# Patient Record
Sex: Female | Born: 2000 | Race: Black or African American | Hispanic: No | Marital: Single | Smoking: Never smoker
Health system: Southern US, Community
[De-identification: ages and names within clinical notes are randomized; demographics above are authoritative.]

## PROBLEM LIST (undated history)

## (undated) DIAGNOSIS — B20 Human immunodeficiency virus [HIV] disease: Secondary | ICD-10-CM

## (undated) DIAGNOSIS — T7840XA Allergy, unspecified, initial encounter: Secondary | ICD-10-CM

## (undated) DIAGNOSIS — U071 COVID-19: Secondary | ICD-10-CM

## (undated) DIAGNOSIS — I10 Essential (primary) hypertension: Secondary | ICD-10-CM

## (undated) HISTORY — DX: Allergy, unspecified, initial encounter: T78.40XA

## (undated) HISTORY — PX: FEMUR FRACTURE SURGERY: SHX633

## (undated) HISTORY — DX: Human immunodeficiency virus (HIV) disease: B20

## (undated) HISTORY — DX: Essential (primary) hypertension: I10

---
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
## 2021-05-17 ENCOUNTER — Ambulatory Visit
Admission: EM | Admit: 2021-05-17 | Discharge: 2021-05-17 | Disposition: A | Discharge status: Home / Self Care | Payer: BC Managed Care – PPO

## 2021-05-17 ENCOUNTER — Encounter: Payer: Self-pay | Admitting: *Deleted

## 2021-05-17 ENCOUNTER — Other Ambulatory Visit: Payer: Self-pay

## 2021-05-17 ENCOUNTER — Ambulatory Visit: Payer: Self-pay

## 2021-05-17 DIAGNOSIS — L500 Allergic urticaria: Secondary | ICD-10-CM | POA: Diagnosis not present

## 2021-05-17 DIAGNOSIS — U071 COVID-19: Secondary | ICD-10-CM | POA: Diagnosis not present

## 2021-05-17 HISTORY — DX: COVID-19: U07.1

## 2021-05-17 MED ORDER — PREDNISONE 10 MG PO TABS: ORAL_TABLET | ORAL | 0 refills | Status: DC

## 2021-05-17 MED ORDER — FAMOTIDINE 40 MG PO TABS: 40.0000 mg | ORAL_TABLET | Freq: Two times a day (BID) | ORAL | 0 refills | Status: DC

## 2021-05-17 MED ORDER — CETIRIZINE HCL 10 MG PO TABS: 10.0000 mg | ORAL_TABLET | Freq: Two times a day (BID) | ORAL | 0 refills | Status: DC

## 2021-05-17 NOTE — Telephone Encounter (Signed)
Answer Assessment - Initial Assessment Questions 1. NAME of MEDICATION: "What medicine are you calling about?"     Paxlovid 2. QUESTION: "What is your question?" (e.g., double dose of medicine, side effect)     Causing hives and redness to skin 3. PRESCRIBING HCP: "Who prescribed it?" Reason: if prescribed by specialist, call should be referred to that group.     [facility] 4. SYMPTOMS: "Do you have any symptoms?"     Yes 5. SEVERITY: If symptoms are present, ask "Are they mild, moderate or severe?"     Moderate 6. PREGNANCY:  "Is there any chance that you are pregnant?" "When was your last menstrual period?"     No  Protocols used: Medication Question Call-A-AH

## 2021-05-17 NOTE — ED Triage Notes (Signed)
T/C to pt as she waits in car; reports having allergic reaction since starting Paxlovid 2 days ago.  C/O generalized hives.  Denies any wheezing, oral, or throat swelling, itching, or sensations.  Instructed pt to notify [facility] if anything changes; pt verbalized understanding.

## 2021-05-17 NOTE — Telephone Encounter (Signed)
Pt. Recently started Paxlovid and today broke out in hives "all over, even my toes." Called [facility] who prescribed the medication. States :they don't have any openings." Will try e-visit through [facility] or go to UC.

## 2021-05-17 NOTE — ED Provider Notes (Signed)
[facility]    CSN: [id number] Arrival date & time: 05/17/21  1633      History   Chief Complaint Chief Complaint  Patient presents with   Urticaria    Covid +    HPI Pam Jones is a 20 y.o. female.   Patient presenting today with generalized pruritic hives rash following starting paxlovid for COVID infection. She denies wheezing, throat swelling or itching, difficulty breathing or swallowing, fever, cough, N/V. So far has not tried anything for sxs. Stopped the paxlovid immediately upon noticing the rash.    Past Medical History:  Diagnosis Date   Allergy    COVID-19    04/2021    There are no problems to display for this patient.   Past Surgical History:  Procedure Laterality Date   FEMUR FRACTURE SURGERY      OB History   No obstetric history on file.      Home Medications    Prior to Admission medications   Medication Sig Start Date End Date Taking? Authorizing Provider  AMOXICILLIN PO Take by mouth.   Yes [provider]  cetirizine (ZYRTEC ALLERGY) 10 MG tablet Take 1 tablet (10 mg total) by mouth 2 (two) times daily. 05/17/21  Yes Particia Nearing, PA-C  famotidine (PEPCID) 40 MG tablet Take 1 tablet (40 mg total) by mouth 2 (two) times daily. 05/17/21  Yes Particia Nearing, PA-C  predniSONE (DELTASONE) 10 MG tablet Take 6 tabs daily x 2 days, 5 tabs daily x 2 days, 4 tabs daily x 2 days, etc 05/17/21  Yes Particia Nearing, PA-C  fluticasone [facility]) 50 MCG/ACT nasal spray Place 2 sprays into the nose daily. 08/13/13   Carmelina Dane, MD  ibuprofen (ADVIL,MOTRIN) 600 MG tablet Take 1 tablet (600 mg total) by mouth every 6 (six) hours as needed. 07/05/16   Ronnell Freshwater, NP  ipratropium (ATROVENT) 0.03 % nasal spray Place 2 sprays into the nose 2 (two) times daily. 02/25/13   Godfrey Pick, PA-C  oseltamivir (TAMIFLU) 75 MG capsule Take 1 capsule (75 mg total) by mouth 2 (two) times daily.  11/13/13   [name], MD    Family History Family History  Problem Relation Age of Onset   Healthy Mother    Hypertension Father    Hypertension Paternal Grandmother    Diabetes Paternal Grandmother     Social History Social History   Tobacco Use   Smoking status: Never    Passive exposure: Yes   Smokeless tobacco: Never  Vaping Use   Vaping Use: Never used  Substance Use Topics   Alcohol use: No   Drug use: No     Allergies   Kiwi extract, Paxlovid [nirmatrelvir-ritonavir], and Strawberry (diagnostic)   Review of Systems Review of Systems PER HPI  Physical Exam Triage Vital Signs ED Triage Vitals [05/17/21 1911]  Enc Vitals Group     BP 125/75     Pulse Rate 80     Resp 16     Temp 98.4 F (36.9 C)     Temp Source Temporal     SpO2 98 %     Weight      Height      Head Circumference      Peak Flow      Pain Score 0     Pain Loc      Pain Edu?      Excl. in GC?    No  data found.  Updated Vital Signs BP 125/75   Pulse 80   Temp 98.4 F (36.9 C) (Temporal)   Resp 16   LMP 05/03/2021 (Exact Date)   SpO2 98%   Visual Acuity Right Eye Distance:   Left Eye Distance:   Bilateral Distance:    Right Eye Near:   Left Eye Near:    Bilateral Near:     Physical Exam Vitals and nursing note reviewed.  Constitutional:      Appearance: Normal appearance. She is not ill-appearing.  HENT:     Head: Atraumatic.     Nose: Nose normal.     Mouth/Throat:     Mouth: Mucous membranes are moist.     Pharynx: Oropharynx is clear. No oropharyngeal exudate or posterior oropharyngeal erythema.     Comments: Uvula midline, no erythema or edema, oral airway patent Eyes:     Extraocular Movements: Extraocular movements intact.     Conjunctiva/sclera: Conjunctivae normal.  Cardiovascular:     Rate and Rhythm: Normal rate and regular rhythm.     Heart sounds: Normal heart sounds.  Pulmonary:     Effort: Pulmonary effort is normal.     Breath sounds:  Normal breath sounds. No wheezing or rales.  Musculoskeletal:        General: Normal range of motion.     Cervical back: Normal range of motion and neck supple.  Skin:    General: Skin is warm and dry.     Findings: Rash (diffuse erythematous maculopapular rash across body) present.  Neurological:     Mental Status: She is alert and oriented to person, place, and time.  Psychiatric:        Mood and Affect: Mood normal.        Thought Content: Thought content normal.        Judgment: Judgment normal.     UC Treatments / Results  Labs (all labs ordered are listed, but only abnormal results are displayed) Labs Reviewed - No data to display  EKG   Radiology No results found.  Procedures Procedures (including critical care time)  Medications Ordered in UC Medications - No data to display  Initial Impression / Assessment and Plan / UC Course  I have reviewed the triage vital signs and the nursing notes.  Pertinent labs & imaging results that were available during my care of the patient were reviewed by me and considered in my medical decision making (see chart for details).     Generalized hives, likely from paxlovid as this is only new exposure. Added to allergy list, continue d/c. No evidence of systemic involvement of reaction. Extended prednisone taper, pepcid, zyrtec sent and reviewed. Supportive care and return precautions discussed. Per patient is improving well from COVID without concerns.   Final Clinical Impressions(s) / UC Diagnoses   Final diagnoses:  Allergic urticaria  COVID-19   Discharge Instructions   None    ED Prescriptions     Medication Sig Dispense Auth. Provider   predniSONE (DELTASONE) 10 MG tablet Take 6 tabs daily x 2 days, 5 tabs daily x 2 days, 4 tabs daily x 2 days, etc 42 tablet Particia Nearing, PA-C   cetirizine (ZYRTEC ALLERGY) 10 MG tablet Take 1 tablet (10 mg total) by mouth 2 (two) times daily. 30 tablet Particia Nearing, [place]   famotidine (PEPCID) 40 MG tablet Take 1 tablet (40 mg total) by mouth 2 (two) times daily. 30 tablet Particia Nearing, [place]  PDMP not reviewed this encounter.   Roosvelt Maser [place], [place] 05/21/21 [phone]

## 2021-07-21 ENCOUNTER — Encounter (HOSPITAL_COMMUNITY): Payer: Self-pay

## 2021-07-21 ENCOUNTER — Other Ambulatory Visit: Payer: Self-pay

## 2021-07-21 ENCOUNTER — Emergency Department (HOSPITAL_COMMUNITY)
Admission: EM | Admit: 2021-07-21 | Discharge: 2021-07-21 | Disposition: A | Discharge status: Home / Self Care | Payer: BC Managed Care – PPO | Attending: Emergency Medicine | Admitting: Emergency Medicine

## 2021-07-21 DIAGNOSIS — K029 Dental caries, unspecified: Secondary | ICD-10-CM | POA: Insufficient documentation

## 2021-07-21 DIAGNOSIS — Z8616 Personal history of COVID-19: Secondary | ICD-10-CM | POA: Insufficient documentation

## 2021-07-21 DIAGNOSIS — K0889 Other specified disorders of teeth and supporting structures: Secondary | ICD-10-CM

## 2021-07-21 LAB — CBC WITH DIFFERENTIAL/PLATELET
Abs Immature Granulocytes: 0.02 10*3/uL (ref 0.00–0.07)
Basophils Absolute: 0 10*3/uL (ref 0.0–0.1)
Basophils Relative: 0 %
Eosinophils Absolute: 0 10*3/uL (ref 0.0–0.5)
Eosinophils Relative: 0 %
HCT: 36.6 % (ref 36.0–46.0)
Hemoglobin: 11.6 g/dL — ABNORMAL LOW (ref 12.0–15.0)
Immature Granulocytes: 0 %
Lymphocytes Relative: 27 %
Lymphs Abs: 1.9 10*3/uL (ref 0.7–4.0)
MCH: 26.2 pg (ref 26.0–34.0)
MCHC: 31.7 g/dL (ref 30.0–36.0)
MCV: 82.8 fL (ref 80.0–100.0)
Monocytes Absolute: 0.5 10*3/uL (ref 0.1–1.0)
Monocytes Relative: 7 %
Neutro Abs: 4.7 10*3/uL (ref 1.7–7.7)
Neutrophils Relative %: 66 %
Platelets: 246 10*3/uL (ref 150–400)
RBC: 4.42 MIL/uL (ref 3.87–5.11)
RDW: 13.3 % (ref 11.5–15.5)
WBC: 7.2 10*3/uL (ref 4.0–10.5)
nRBC: 0 % (ref 0.0–0.2)

## 2021-07-21 LAB — CBG MONITORING, ED: Glucose-Capillary: 87 mg/dL (ref 70–99)

## 2021-07-21 LAB — COMPREHENSIVE METABOLIC PANEL
ALT: 23 U/L (ref 0–44)
AST: 24 U/L (ref 15–41)
Albumin: 4.1 g/dL (ref 3.5–5.0)
Alkaline Phosphatase: 54 U/L (ref 38–126)
Anion gap: 8 (ref 5–15)
BUN: 12 mg/dL (ref 6–20)
CO2: 24 mmol/L (ref 22–32)
Calcium: 9.3 mg/dL (ref 8.9–10.3)
Chloride: 106 mmol/L (ref 98–111)
Creatinine, Ser: 0.9 mg/dL (ref 0.44–1.00)
GFR, Estimated: >60 mL/min (ref >60)
Glucose, Bld: 90 mg/dL (ref 70–99)
Potassium: 3.7 mmol/L (ref 3.5–5.1)
Sodium: 138 mmol/L (ref 135–145)
Total Bilirubin: 0.7 mg/dL (ref 0.3–1.2)
Total Protein: 6.8 g/dL (ref 6.5–8.1)

## 2021-07-21 LAB — I-STAT BETA HCG BLOOD, ED (MC, WL, AP ONLY): I-stat hCG, quantitative: <5 m[IU]/mL (ref <5)

## 2021-07-21 MED ORDER — KETOROLAC TROMETHAMINE 15 MG/ML IJ SOLN
15.0000 mg | Freq: Once | INTRAMUSCULAR | Status: AC
  Administered 2021-07-21: 15 mg via INTRAMUSCULAR
  Filled 2021-07-21: qty 1

## 2021-07-21 NOTE — ED Provider Notes (Signed)
[facility] EMERGENCY DEPARTMENT Provider Note   CSN: [id number] Arrival date & time: 07/21/21  1713     History Chief Complaint  Patient presents with   Rt sided tooth pain    Pam Jones is a 20 y.o. female.  20 year old female with prior medical history as detailed below presents for evaluation.  Patient complains of pain to a tooth on the right lower jaw.  This has been an ongoing issue for the last several months.  Patient reports increased pain several days.  Patient reports that she was started on amoxicillin a couple of days prior. She reports mild allergic reaction to Amoxicillin and her dentist changed her to a different antibiotic.  Also, of note, patient with brief period of lightheadedness while in triage.  This occurred immediately after blood draw.  Patient reports that the symptoms have completely resolved upon my evaluation.  The history is provided by the patient.  Illness Location:  Dental pain Severity:  Mild Onset quality:  Gradual Duration:  5 days Timing:  Constant Progression:  Waxing and waning Chronicity:  Recurrent Associated symptoms: no fever       Past Medical History:  Diagnosis Date   Allergy    COVID-19    04/2021    There are no problems to display for this patient.   Past Surgical History:  Procedure Laterality Date   FEMUR FRACTURE SURGERY       OB History   No obstetric history on file.     Family History  Problem Relation Age of Onset   Healthy Mother    Hypertension Father    Hypertension Paternal Grandmother    Diabetes Paternal Grandmother     Social History   Tobacco Use   Smoking status: Never    Passive exposure: Yes   Smokeless tobacco: Never  Vaping Use   Vaping Use: Never used  Substance Use Topics   Alcohol use: No   Drug use: No    Home Medications Prior to Admission medications   Medication Sig Start Date End Date Taking? Authorizing Provider  AMOXICILLIN PO Take by  mouth.    [provider]  cetirizine (ZYRTEC ALLERGY) 10 MG tablet Take 1 tablet (10 mg total) by mouth 2 (two) times daily. 05/17/21   Particia Nearing, PA-C  famotidine (PEPCID) 40 MG tablet Take 1 tablet (40 mg total) by mouth 2 (two) times daily. 05/17/21   Particia Nearing, PA-C  fluticasone [facility]) 50 MCG/ACT nasal spray Place 2 sprays into the nose daily. 08/13/13   Carmelina Dane, MD  ibuprofen (ADVIL,MOTRIN) 600 MG tablet Take 1 tablet (600 mg total) by mouth every 6 (six) hours as needed. 07/05/16   Ronnell Freshwater, NP  ipratropium (ATROVENT) 0.03 % nasal spray Place 2 sprays into the nose 2 (two) times daily. 02/25/13   Godfrey Pick, PA-C  oseltamivir (TAMIFLU) 75 MG capsule Take 1 capsule (75 mg total) by mouth 2 (two) times daily. 11/13/13   [name], MD  predniSONE (DELTASONE) 10 MG tablet Take 6 tabs daily x 2 days, 5 tabs daily x 2 days, 4 tabs daily x 2 days, etc 05/17/21   Particia Nearing, PA-C    Allergies    Kiwi extract, Paxlovid [nirmatrelvir-ritonavir], and Strawberry (diagnostic)  Review of Systems   Review of Systems  Constitutional:  Negative for fever.  All other systems reviewed and are negative.  Physical Exam Updated Vital Signs BP  131/87 (BP Location: Right Arm)   Pulse 79   Temp 98 F (36.7 C) (Oral)   Resp 18   SpO2 100%   Physical Exam Vitals and nursing note reviewed.  Constitutional:      General: She is not in acute distress.    Appearance: Normal appearance. She is well-developed.  HENT:     Head: Normocephalic and atraumatic.     Mouth/Throat:     Comments: Diffuse mild dental decay - no dental abscess noted.  Eyes:     Conjunctiva/sclera: Conjunctivae normal.     Pupils: Pupils are equal, round, and reactive to light.  Cardiovascular:     Rate and Rhythm: Normal rate and regular rhythm.     Heart sounds: Normal heart sounds.  Pulmonary:     Effort: Pulmonary effort is normal. No  respiratory distress.     Breath sounds: Normal breath sounds.  Abdominal:     General: There is no distension.     Palpations: Abdomen is soft.     Tenderness: There is no abdominal tenderness.  Musculoskeletal:        General: No deformity. Normal range of motion.     Cervical back: Normal range of motion and neck supple.  Skin:    General: Skin is warm and dry.  Neurological:     General: No focal deficit present.     Mental Status: She is alert and oriented to person, place, and time.    ED Results / Procedures / Treatments   Labs (all labs ordered are listed, but only abnormal results are displayed) Labs Reviewed  CBC WITH DIFFERENTIAL/PLATELET - Abnormal; Notable for the following components:      Result Value   Hemoglobin 11.6 (*)    All other components within normal limits  COMPREHENSIVE METABOLIC PANEL  I-STAT BETA HCG BLOOD, ED ([facility], [facility], [facility] ONLY)  CBG MONITORING, ED    EKG EKG Interpretation  Date/Time:  Friday July 21 2021 18:12:28 EDT Ventricular Rate:  78 PR Interval:  168 QRS Duration: 84 QT Interval:  346 QTC Calculation: 394 R Axis:   84 Text Interpretation: Normal sinus rhythm with sinus arrhythmia Normal ECG Confirmed by Kristine Royal [phone]) on 07/21/2021 8:31:25 PM  Radiology No results found.  Procedures Procedures   Medications Ordered in ED Medications  ketorolac (TORADOL) 15 MG/ML injection 15 mg (has no administration in time range)    ED Course  I have reviewed the triage vital signs and the nursing notes.  Pertinent labs & imaging results that were available during my care of the patient were reviewed by me and considered in my medical decision making (see chart for details).  Clinical Course as of 07/25/21 6283  Fri Jul 21, 2021  1831 Patient had what appeared to be a near syncopal event after she got her blood drawn.  She became diaphoretic, lightheaded and began being very tremulous.  CBG is 87.  She was laid down and felt  better.  She has not eaten in 4 hours.  EKG was obtained during this time.  She states that she donates blood often and does not have a similar reaction.   [EH]    Clinical Course User Index [EH] Norman Clay   MDM Rules/Calculators/A&P                           MDM   MSE complete  [name] was evaluated in Emergency  Department on 07/25/2021 for the symptoms described in the history of present illness. She was evaluated in the context of the global COVID-19 pandemic, which necessitated consideration that the patient might be at risk for infection with the SARS-CoV-2 virus that causes COVID-19. Institutional protocols and algorithms that pertain to the evaluation of patients at risk for COVID-19 are in a state of rapid change based on information released by regulatory bodies including the CDC and federal and state organizations. These policies and algorithms were followed during the patient's care in the ED.  Patient with complaint of mild dental pain.   She reports that she is already taking antibiotics for same.   She requests pain medication for dental pain.  Pain improved with Toradol.   Screening labs obtained are without significant findings.   Reported vasovagal symptoms with triage blood draw are now resolved.   She does understand need for close FU with dentistry. Strict return precautions given and understood.  Final Clinical Impression(s) / ED Diagnoses Final diagnoses:  Pain, dental    Rx / DC Orders ED Discharge Orders     None        Wynetta Fines, MD 07/25/21 [phone]

## 2021-07-21 NOTE — ED Provider Notes (Signed)
Emergency Medicine Provider Triage Evaluation Note  Pam Jones , a 20 y.o. female  was evaluated in triage.  Pt complains of right-sided dental pain.  She states that she was seen for similar around earlier this year after she had a infection in January.  She had an allergic reaction to amoxicillin.  She states that she has been to 3 dentist and they will tell her that she needs to have her other dental issues fixed before they will fix or fill/pull that tooth.  She states that today her primary concern is that she has pain that shoots up into her head and even down her right arm.  She denies any fevers..  Review of Systems  Positive: Right-sided dental pain, tooth pain, right-sided arm pain Negative: Cough, shortness of breath  Physical Exam  BP 138/90 (BP Location: Left Arm)   Pulse 77   Temp 99 F (37.2 C) (Oral)   Resp 14   SpO2 100%  Gen:   Awake, no distress   Resp:  Normal effort  MSK:   Moves extremities without difficulty  Other:  Well exam without obvious intraoral edema.  Uvula is midline.  No trismus.  She does have nontender left sided cervical lymphadenopathy as opposed to the right sided dental pain where she does not have any right-sided lymphadenopathy.  Medical Decision Making  Medically screening exam initiated at 6:11 PM.  Appropriate orders placed.  Pam Jones was informed that the remainder of the evaluation will be completed by another provider, this initial triage assessment does not replace that evaluation, and the importance of remaining in the ED until their evaluation is complete.  Given that patient is feeling like this dental pain is now shooting down into her arm I feel that this is a atypical pattern and patient requires additional work-up.     Norman Clay 07/21/21 1812    Cheryll Cockayne, MD 07/24/21 [phone]

## 2021-07-21 NOTE — Discharge Instructions (Addendum)
Return for any problem.  ?

## 2021-07-21 NOTE — ED Triage Notes (Signed)
Pt came in via POV d/t tooth pain that worsened last night, her initial tooth ache began around January & she did see her dentist back in June & she was prescribed Amoxicillin that she had an allergic reaction to. The pain became severe enough that she states it "spread" into her jaw/face/right arm.

## 2022-03-26 DIAGNOSIS — Z21 Asymptomatic human immunodeficiency virus [HIV] infection status: Secondary | ICD-10-CM

## 2022-03-26 DIAGNOSIS — B20 Human immunodeficiency virus [HIV] disease: Secondary | ICD-10-CM

## 2022-03-26 HISTORY — DX: Human immunodeficiency virus (HIV) disease: B20

## 2022-03-26 HISTORY — DX: Asymptomatic human immunodeficiency virus (hiv) infection status: Z21

## 2022-04-05 ENCOUNTER — Encounter (HOSPITAL_COMMUNITY): Payer: Self-pay | Admitting: Emergency Medicine

## 2022-04-05 ENCOUNTER — Emergency Department (HOSPITAL_COMMUNITY): Payer: BC Managed Care – PPO

## 2022-04-05 ENCOUNTER — Emergency Department (HOSPITAL_COMMUNITY)
Admission: EM | Admit: 2022-04-05 | Discharge: 2022-04-06 | Disposition: A | Discharge status: Home / Self Care | Payer: BC Managed Care – PPO | Attending: Emergency Medicine | Admitting: Emergency Medicine

## 2022-04-05 ENCOUNTER — Other Ambulatory Visit: Payer: Self-pay

## 2022-04-05 DIAGNOSIS — Z21 Asymptomatic human immunodeficiency virus [HIV] infection status: Secondary | ICD-10-CM | POA: Diagnosis not present

## 2022-04-05 DIAGNOSIS — R079 Chest pain, unspecified: Secondary | ICD-10-CM | POA: Diagnosis present

## 2022-04-05 DIAGNOSIS — N9489 Other specified conditions associated with female genital organs and menstrual cycle: Secondary | ICD-10-CM | POA: Diagnosis not present

## 2022-04-05 DIAGNOSIS — R42 Dizziness and giddiness: Secondary | ICD-10-CM | POA: Insufficient documentation

## 2022-04-05 DIAGNOSIS — R0789 Other chest pain: Secondary | ICD-10-CM | POA: Diagnosis not present

## 2022-04-05 LAB — CBC WITH DIFFERENTIAL/PLATELET
Abs Immature Granulocytes: 0.1 10*3/uL — ABNORMAL HIGH (ref 0.00–0.07)
Basophils Absolute: 0 10*3/uL (ref 0.0–0.1)
Basophils Relative: 1 %
Eosinophils Absolute: 0.2 10*3/uL (ref 0.0–0.5)
Eosinophils Relative: 5 %
HCT: 34.9 % — ABNORMAL LOW (ref 36.0–46.0)
Hemoglobin: 11.2 g/dL — ABNORMAL LOW (ref 12.0–15.0)
Lymphocytes Relative: 32 %
Lymphs Abs: 1.4 10*3/uL (ref 0.7–4.0)
MCH: 26.1 pg (ref 26.0–34.0)
MCHC: 32.1 g/dL (ref 30.0–36.0)
MCV: 81.4 fL (ref 80.0–100.0)
Metamyelocytes Relative: 2 %
Monocytes Absolute: 0.3 10*3/uL (ref 0.1–1.0)
Monocytes Relative: 7 %
Neutro Abs: 2.3 10*3/uL (ref 1.7–7.7)
Neutrophils Relative %: 53 %
Platelets: 191 10*3/uL (ref 150–400)
RBC: 4.29 MIL/uL (ref 3.87–5.11)
RDW: 14.4 % (ref 11.5–15.5)
WBC: 4.4 10*3/uL (ref 4.0–10.5)
nRBC: 0 % (ref 0.0–0.2)
nRBC: 1 /100 WBC — ABNORMAL HIGH

## 2022-04-05 LAB — BASIC METABOLIC PANEL
Anion gap: 5 (ref 5–15)
BUN: 10 mg/dL (ref 6–20)
CO2: 24 mmol/L (ref 22–32)
Calcium: 9 mg/dL (ref 8.9–10.3)
Chloride: 111 mmol/L (ref 98–111)
Creatinine, Ser: 0.88 mg/dL (ref 0.44–1.00)
GFR, Estimated: >60 mL/min (ref >60)
Glucose, Bld: 91 mg/dL (ref 70–99)
Potassium: 3.6 mmol/L (ref 3.5–5.1)
Sodium: 140 mmol/L (ref 135–145)

## 2022-04-05 LAB — TROPONIN I (HIGH SENSITIVITY): Troponin I (High Sensitivity): <2 ng/L (ref <18)

## 2022-04-05 LAB — I-STAT BETA HCG BLOOD, ED (MC, WL, AP ONLY): I-stat hCG, quantitative: <5 m[IU]/mL (ref <5)

## 2022-04-05 NOTE — ED Provider Triage Note (Signed)
Emergency Medicine Provider Triage Evaluation Note ? ?Pam Jones , a 21 y.o. female  was evaluated in triage.  Pt complains of chest pain.  She reports this has been occurring intermittently for 1 to 2 weeks.  She reports brief squeezing chest pains that may be last a minute but occur multiple times throughout the day.  She denies any specific trigger, pain is not worse with exertion and is not pleuritic.  Some associated lightheadedness with pain.  No nausea or vomiting.  No fevers, chills or cough.  Patient was recently diagnosed with HIV but has not yet started treatment. ? ?Review of Systems  ?Positive: Chest pain, lightheadedness ?Negative: Shortness of breath, fever, cough, nausea, vomiting, abdominal pain ? ?Physical Exam  ?BP 130/74 (BP Location: Right Arm)   Pulse 85   Temp 98.6 ?F (37 ?C) (Oral)   Resp 18   Wt 72.6 kg   SpO2 100%  ?Gen:   Awake, no distress   ?Resp:  Normal effort, CTA bilat, RRR ?MSK:   Moves extremities without difficulty  ?Other:   ? ?Medical Decision Making  ?Medically screening exam initiated at 10:22 PM.  Appropriate orders placed.  Pam Jones was informed that the remainder of the evaluation will be completed by another provider, this initial triage assessment does not replace that evaluation, and the importance of remaining in the ED until their evaluation is complete. ? ?Chest pain work-up initiated ?  ?Dartha Lodge, PA-C ?04/05/22 2228 ? ?

## 2022-04-05 NOTE — ED Triage Notes (Incomplete)
Right sided CP for the past several days, not worse with any particular activity. Endorses dizziness with standing with this.  ? ?R arm numbness and R neck swelling and discomfort that started yesterday after having blood draw. ? ?Endorses recent new HIV diagnosis. ?

## 2022-04-06 NOTE — ED Notes (Signed)
Patient verbalizes understanding of discharge instructions. Opportunity for questioning and answers were provided. Armband removed by staff, pt discharged from ED ambulatory.   

## 2022-04-06 NOTE — ED Notes (Signed)
Per PA pt does not need delta trop  ?

## 2022-04-06 NOTE — Discharge Instructions (Signed)
You were seen in the emergency department today for chest pain. Your work-up in the emergency department has been overall reassuring. Your labs have been fairly normal and or similar to previous blood work you have had done. Your EKG and the enzyme we use to check your heart did not show an acute heart attack at this time. Your chest x-ray was normal.   We would like you to follow up closely with your primary care provider   within 1 week. Return to the ER immediately should you experience any new or worsening symptoms including but not limited to return of pain, worsened pain, vomiting, shortness of breath, dizziness, lightheadedness, passing out, or any other concerns that you may have.    

## 2022-04-06 NOTE — ED Provider Notes (Signed)
?[facility] ?Provider Note ? ? ?CSN: [id number] ?Arrival date & time: 04/05/22  2121 ? ?  ? ?History ? ?Chief Complaint  ?Patient presents with  ? Chest Pain  ? ? ?Pam Jones is a 21 y.o. female. ? ?Pam Jones is a 21 y.o. female with a history of allergies, and recently diagnosed HIV, who presents to the emergency department for evaluation of chest pain.  Patient reports thatthis has been occurring intermittently for 1 to 2 weeks.  She reports brief squeezing chest pains that may be last a minute but occur multiple times throughout the day.  She denies any specific trigger, pain is not worse with exertion and is not pleuritic.  She does not report that she thinks the pain comes on when she feels anxious.  Some associated lightheadedness with pain.  No nausea or vomiting.  No fevers, chills or cough.  Pain is nonradiating.  Patient was recently diagnosed with HIV, has not yet started treatment but has a follow-up appointment with [facility] next week.  No cardiac history.  No history of blood clots.  Patient is not on estrogen-containing birth control. ? ?The history is provided by the patient.  ?Chest Pain ?Associated symptoms: no abdominal pain, no cough, no fever, no headache, no nausea, no palpitations, no shortness of breath and no vomiting   ? ?  ? ?Home Medications ?Prior to Admission medications   ?Medication Sig Start Date End Date Taking? Authorizing Provider  ?AMOXICILLIN PO Take by mouth.    [provider]  ?cetirizine (ZYRTEC ALLERGY) 10 MG tablet Take 1 tablet (10 mg total) by mouth 2 (two) times daily. 05/17/21   Volney American, PA-C  ?famotidine (PEPCID) 40 MG tablet Take 1 tablet (40 mg total) by mouth 2 (two) times daily. 05/17/21   Volney American, PA-C  ?fluticasone (FLONASE) 50 MCG/ACT nasal spray Place 2 sprays into the nose daily. 08/13/13   Roselee Culver, MD  ?ibuprofen (ADVIL,MOTRIN) 600 MG tablet Take 1 tablet (600  mg total) by mouth every 6 (six) hours as needed. 07/05/16   Benjamine Sprague, NP  ?ipratropium (ATROVENT) 0.03 % nasal spray Place 2 sprays into the nose 2 (two) times daily. 02/25/13   Theda Sers, PA-C  ?oseltamivir (TAMIFLU) 75 MG capsule Take 1 capsule (75 mg total) by mouth 2 (two) times daily. 11/13/13   Wardell Honour, MD  ?predniSONE (DELTASONE) 10 MG tablet Take 6 tabs daily x 2 days, 5 tabs daily x 2 days, 4 tabs daily x 2 days, etc 05/17/21   Volney American, PA-C  ?   ? ?Allergies    ?Kiwi extract, Paxlovid [nirmatrelvir-ritonavir], and Strawberry (diagnostic)   ? ?Review of Systems   ?Review of Systems  ?Constitutional:  Negative for chills and fever.  ?HENT: Negative.    ?Respiratory:  Negative for cough and shortness of breath.   ?Cardiovascular:  Positive for chest pain. Negative for palpitations and leg swelling.  ?Gastrointestinal:  Negative for abdominal pain, nausea and vomiting.  ?Neurological:  Positive for light-headedness. Negative for syncope and headaches.  ?Psychiatric/Behavioral:  The patient is nervous/anxious.   ? ?Physical Exam ?Updated Vital Signs ?BP 130/74 (BP Location: Right Arm)   Pulse 85   Temp 98.6 ?F (37 ?C) (Oral)   Resp 18   Wt 72.6 kg   SpO2 100%  ?Physical Exam ?Vitals and nursing note reviewed.  ?Constitutional:   ?   General: She is not  in acute distress. ?   Appearance: Normal appearance. She is well-developed. She is not ill-appearing or diaphoretic.  ?HENT:  ?   Head: Normocephalic and atraumatic.  ?Eyes:  ?   General:     ?   Right eye: No discharge.     ?   Left eye: No discharge.  ?   Pupils: Pupils are equal, round, and reactive to light.  ?Cardiovascular:  ?   Rate and Rhythm: Normal rate and regular rhythm.  ?   Pulses: Normal pulses.     ?     Radial pulses are 2+ on the right side and 2+ on the left side.  ?     Dorsalis pedis pulses are 2+ on the right side and 2+ on the left side.  ?   Heart sounds: Normal heart sounds. No murmur  heard. ?  No gallop.  ?Pulmonary:  ?   Effort: Pulmonary effort is normal. No respiratory distress.  ?   Breath sounds: Normal breath sounds. No wheezing or rales.  ?   Comments: Respirations equal and unlabored, patient able to speak in full sentences, lungs clear to auscultation bilaterally  ?Chest:  ?   Chest wall: No tenderness.  ?Abdominal:  ?   General: Bowel sounds are normal. There is no distension.  ?   Palpations: Abdomen is soft. There is no mass.  ?   Tenderness: There is no abdominal tenderness. There is no guarding.  ?   Comments: Abdomen soft, nondistended, nontender to palpation in all quadrants without guarding or peritoneal signs  ?Musculoskeletal:     ?   General: No deformity.  ?   Cervical back: Neck supple.  ?   Right lower leg: No tenderness. No edema.  ?   Left lower leg: No tenderness. No edema.  ?Skin: ?   General: Skin is warm and dry.  ?   Capillary Refill: Capillary refill takes less than 2 seconds.  ?Neurological:  ?   Mental Status: She is alert and oriented to person, place, and time.  ?   Coordination: Coordination normal.  ?   Comments: Speech is clear, able to follow commands ?Moves extremities without ataxia, coordination intact  ?Psychiatric:     ?   Mood and Affect: Mood normal.     ?   Behavior: Behavior normal.  ? ? ?ED Results / Procedures / Treatments   ?Labs ?(all labs ordered are listed, but only abnormal results are displayed) ?Labs Reviewed  ?CBC WITH DIFFERENTIAL/PLATELET - Abnormal; Notable for the following components:  ?    Result Value  ? Hemoglobin 11.2 (*)   ? HCT 34.9 (*)   ? nRBC 1 (*)   ? Abs Immature Granulocytes 0.10 (*)   ? All other components within normal limits  ?BASIC METABOLIC PANEL  ?I-STAT BETA HCG BLOOD, ED ([facility], [facility], [facility] ONLY)  ?TROPONIN I (HIGH SENSITIVITY)  ?TROPONIN I (HIGH SENSITIVITY)  ? ? ?EKG ?EKG Interpretation ? ?Date/Time:  Thursday Apr 05 2022 22:29:08 EDT ?Ventricular Rate:  91 ?PR Interval:  142 ?QRS Duration: 86 ?QT Interval:  352 ?QTC  Calculation: 432 ?R Axis:   93 ?Text Interpretation: Normal sinus rhythm Rightward axis Nonspecific T wave abnormality Abnormal ECG When compared with ECG of 21-Jul-2021 18:12, PREVIOUS ECG IS PRESENT Confirmed by Merrily Pew [phone]) on 04/06/2022 2:03:35 AM ? ?Radiology ?DG Chest 2 View ? ?Result Date: 04/05/2022 ?CLINICAL DATA:  Chest pain. EXAM: CHEST - 2 VIEW COMPARISON:  None Available.  FINDINGS: The cardiomediastinal contours are normal. The lungs are clear. Pulmonary vasculature is normal. No consolidation, pleural effusion, or pneumothorax. No acute osseous abnormalities are seen. IMPRESSION: Negative radiographs of the chest. Electronically Signed   By: Keith Rake M.D.   On: 04/05/2022 22:49   ? ?Procedures ?Procedures  ? ? ?Medications Ordered in ED ?Medications - No data to display ? ?ED Course/ Medical Decision Making/ A&P ?  ?                        ?Medical Decision Making ?Amount and/or Complexity of Data Reviewed ?Labs: ordered. ?Radiology: ordered. ? ? ?Patient presents to the emergency department with chest pain. Patient nontoxic appearing, in no apparent distress, vitals without significant abnormality. Fairly benign physical exam.  ? ?DDX including but not limited to: ACS, pulmonary embolism, dissection, pneumothorax, pneumonia, arrhythmia, severe anemia, MSK, GERD, anxiety, abdominal process.  ? ?Additional history obtained:  ?Chart & nursing note reviewed.  ?Reviewed outside records from [facility] ED visits within the last month. ? ?EKG: Sinus rhythm with no ischemic changes ? ?Lab Tests:  ?I reviewed & interpreted labs including:  ?No leukocytosis, stable hemoglobin, no electrolyte derangements, normal renal function, negative pregnancy, initial troponin is negative, given timeline of symptoms do not feel that delta troponin is indicated. ? ?Imaging Studies ordered:  ?I ordered and viewed the following imaging, agree with radiologist impression:  ?No pneumonia, edema or  cardiomegaly ? ?ED Course:  ? ?Heart Pathway Score 1 - EKG without obvious acute ischemia, delta troponin negative, doubt ACS. Patient is low risk wells, PERC negative, doubt pulmonary embolism. Pain is not a tearing sensat

## 2022-06-14 ENCOUNTER — Other Ambulatory Visit: Payer: Self-pay

## 2022-06-14 ENCOUNTER — Ambulatory Visit (INDEPENDENT_AMBULATORY_CARE_PROVIDER_SITE_OTHER): Payer: BC Managed Care – PPO | Admitting: Obstetrics & Gynecology

## 2022-06-14 VITALS — BP 112/64 | HR 89 | Ht 70.0 in | Wt 212.3 lb

## 2022-06-14 DIAGNOSIS — O98719 Human immunodeficiency virus [HIV] disease complicating pregnancy, unspecified trimester: Secondary | ICD-10-CM | POA: Diagnosis not present

## 2022-06-14 DIAGNOSIS — O98711 Human immunodeficiency virus [HIV] disease complicating pregnancy, first trimester: Secondary | ICD-10-CM

## 2022-06-14 DIAGNOSIS — Z3201 Encounter for pregnancy test, result positive: Secondary | ICD-10-CM

## 2022-06-14 DIAGNOSIS — Z3A01 Less than 8 weeks gestation of pregnancy: Secondary | ICD-10-CM

## 2022-06-14 DIAGNOSIS — Z21 Asymptomatic human immunodeficiency virus [HIV] infection status: Secondary | ICD-10-CM

## 2022-06-14 LAB — POCT PREGNANCY, URINE: Preg Test, Ur: POSITIVE — AB

## 2022-06-14 NOTE — Progress Notes (Addendum)
Here for pregnancy test which was positive. She reports sure LMP of 05/02/22. This gives her EDD 02/06/23 and makes her [redacted]w[redacted]d. This is first child. She is HIV+. I explained we recommend she start prenatal vitamins and start prenatal care with provider of her choice.- list placed in AVS.  She is already taking prenatal vitamins. She would like to receive her care with our office. I explained we provider 3 options of prenatal care and explained each to her. She chooses [facility]. I explained I will need to verify her HIV does not exclude her and if not, I will place her appointments in asap. I explained she will still have new ob intake and new ob before she begins [facility]. She voices understanding.Dr. Macon Large in to  meet patient since she is new to our practice. Nancy Fetter

## 2022-06-14 NOTE — Patient Instructions (Addendum)
Prenatal Care Providers           [facility] [place] @ [facility]  [place] [phone]  Center for [facility] @ [place]  [phone]  [place] @ [facility]       [address] [phone]            [facility] [place] @ [place]     [phone]          [facility] [place] @ [facility]   [place] [phone]  [facility] [place] @ [place] [phone]     [facility] [place] @ [address] Linna Hoff)  [place]   [phone]     [place] [facility]  Phone: [facility]  Phone: [facility] Phone: [phone]  [facility] Phone: [phone]  [facility] Phone: [phone]  [facility] Phone: [phone]  [facility]  Phone: [phone]    Healthy Weight Gain During Pregnancy A certain amount of weight gain during pregnancy is normal and healthy. The amount of weight you should gain during pregnancy depends on your overall health and your weight before you became pregnant. Talk with your health care provider to find out how much weight you should gain during your pregnancy. General guidelines for a healthy total weight gain during pregnancy are based on your body mass index (BMI) and are listed below. If your BMI at or before the start of your pregnancy is: Less than 18.5 (underweight), you should gain 28-40 lb (13-18 kg). 18.5-24.9 (normal weight), you should gain 25-35 lb (11-16 kg). 25-29.9 (overweight), you should gain 15-25 lb (7-11 kg). 30 or higher (obese), you should gain 11-20 lb (5-9 kg). Your health care provider may recommend that you: Gain more weight, if you were underweight before pregnancy or if you are  pregnant with more than one baby. Gain less weight, if you were overweight before pregnancy or if you are gaining too much weight during your pregnancy. How does unhealthy weight gain affect me? Gaining too much weight during pregnancy can lead to pregnancy complications, such as: A temporary form of diabetes that develops during pregnancy (gestational diabetes). Hypertensive disorders of pregnancy (preeclampsia or gestational hypertension). Raising your risk of having a more difficult delivery or a surgical delivery (cesarean delivery, or C-section). How does unhealthy weight gain affect my baby? Not gaining enough weight can be life-threatening for your baby. It may also raise these risks for your baby: Being born early (preterm). Being smaller than normal during pregnancy or not growing normally (intrauterine growth restriction). Having a low weight at birth. Gaining too much weight may raise these risks for your baby: Growing larger than normal during pregnancy (large for gestational age or macrosomia). Increased risk of obesity. What actions can I take to gain a healthy amount of weight during pregnancy? Nutrition  Eat healthy foods. Every day, try to eat: Fruits and vegetables. Include a variety of colors and types, like sweet potatoes, oranges, apples, bell peppers, beets, berries, squash, and broccoli. Whole grains, such as millet, barley, whole-wheat breads and cereals, and oatmeal. Low-fat dairy products, like yogurt, milk, and cheese. You can also include non-dairy choices, like almond milk or rice milk. Protein-rich foods, like lean meat, chicken, eggs, peas, and beans.  Avoid foods that are fried or have a lot of fat, salt (sodium), or sugar. Drink enough fluid to keep your urine pale yellow. Choose healthy snack and drink options when you are away from home: Drink water. Avoid soda, sports drinks, and juices that have added sugar. Avoid drinks with caffeine, such as coffee and  energy drinks. Eat snacks that are high in protein, such as nuts, protein bars, and low-fat yogurt. Carry convenient snacks with you that do not need refrigeration, such as a pack of trail mix, an apple, or a granola bar. If you need help improving your diet, work with a health care provider or a dietitian. Activity  Exercise regularly, as told by your health care provider. If you were active before becoming pregnant, you may be able to continue your regular fitness activities. If you were not active before pregnancy, you may gradually build up to exercising for 30 or more minutes on most days of the week. This may include walking, swimming, or yoga. Ask your health care provider what activities are safe for you. Talk with your health care provider about whether you may need to be excused from certain school or work activities. Follow these instructions at home: Take over-the-counter and prescription medicines only as told by your health care provider. Take all prenatal supplements as told by your health care provider. Keep track of your weight gain during pregnancy. Keep all health care visits during pregnancy (prenatal visits). These visits are a good time to discuss your weight gain. Your health care provider will weigh you at each visit to make sure you are gaining a healthy amount of weight. Where to find support Some pregnant women and teens face unique challenges and need extra support. If you have questions or need help gaining a healthy amount of weight during pregnancy, these people may help: Your health care provider. A dietitian. Your school nurse. Your family and friends. Local counseling centers, church groups, or clinics that have services for teens. Where to find more information Learn more about managing your weight gain during pregnancy from: American Pregnancy Association: americanpregnancy.org U.S. Department of Agriculture pregnancy weight gain calculator:  WrestlingReporter.dk Contact a health care provider if: You are unable to eat or drink for longer than 24 hours. You cannot afford food or have trouble accessing regular meals. Get help right away if you: Summary Talk with your health care provider to find out how much weight you should gain during your pregnancy. Too much or too little weight gain during pregnancy can lead to complications for you and your baby. Eat healthy foods like fruits and vegetables, whole grains, low-fat dairy products, and protein-rich foods. Ask your health care provider what activities are safe for you. Keep all of your prenatal visits. This information is not intended to replace advice given to you by your health care provider. Make sure you discuss any questions you have with your health care provider. Document Revised: 06/09/2020 Document Reviewed: 06/09/2020 Elsevier Patient Education  2023 Elsevier Inc. Commonly Asked Questions During Pregnancy  Cats: A parasite can be excreted in cat feces.  To avoid exposure you need to have another person empty the little box.  If you must empty the litter box you will need to wear gloves.  Wash your hands after handling your cat.  This parasite can also be found in raw or undercooked meat so this should also be avoided.  Colds, Sore Throats, Flu: Please check your medication sheet to see what you can  take for symptoms.  If your symptoms are unrelieved by these medications please call the office.  Dental Work: Most any dental work Investment banker, corporate recommends is permitted.  X-rays should only be taken during the first trimester if absolutely necessary.  Your abdomen should be shielded with a lead apron during all x-rays.  Please notify your provider prior to receiving any x-rays.  Novocaine is fine; gas is not recommended.  If your dentist requires a note from [place] prior to dental work please call the office and we will provide one for you.  Exercise: Exercise is an important part of  staying healthy during your pregnancy.  You may continue most exercises you were accustomed to prior to pregnancy.  Later in your pregnancy you will most likely notice you have difficulty with activities requiring balance like riding a bicycle.  It is important that you listen to your body and avoid activities that put you at a higher risk of falling.  Adequate rest and staying well hydrated are a must!  If you have questions about the safety of specific activities ask your provider.    Exposure to Children with illness: Try to avoid obvious exposure; report any symptoms to [place] when noted,  If you have chicken pos, red measles or mumps, you should be immune to these diseases.   Please do not take any vaccines while pregnant unless you have checked with your OB provider.  Fetal Movement: After 28 weeks we recommend you do "kick counts" twice daily.  Lie or sit down in a calm quiet environment and count your baby movements "kicks".  You should feel your baby at least 10 times per hour.  If you have not felt 10 kicks within the first hour get up, walk around and have something sweet to eat or drink then repeat for an additional hour.  If count remains less than 10 per hour notify your provider.  Fumigating: Follow your pest control agent's advice as to how long to stay out of your home.  Ventilate the area well before re-entering.  Hemorrhoids:   Most over-the-counter preparations can be used during pregnancy.  Check your medication to see what is safe to use.  It is important to use a stool softener or fiber in your diet and to drink lots of liquids.  If hemorrhoids seem to be getting worse please call the office.   Hot Tubs:  Hot tubs Jacuzzis and saunas are not recommended while pregnant.  These increase your internal body temperature and should be avoided.  Intercourse:  Sexual intercourse is safe during pregnancy as long as you are comfortable, unless otherwise advised by your provider.  Spotting may  occur after intercourse; report any bright red bleeding that is heavier than spotting.  Labor:  If you know that you are in labor, please go to the hospital.  If you are unsure, please call the office and let us help you decide what to do.  Lifting, straining, etc:  If your job requires heavy lifting or straining please check with your provider for any limitations.  Generally, you should not lift items heavier than that you can lift simply with your hands and arms (no back muscles)  Painting:  Paint fumes do not harm your pregnancy, but may make you ill and should be avoided if possible.  Latex or water based paints have less odor than oils.  Use adequate ventilation while painting.  Permanents & Hair Color:  Chemicals in hair dyes are not  recommended as they cause increase hair dryness which can increase hair loss during pregnancy.  " Highlighting" and permanents are allowed.  Dye may be absorbed differently and permanents may not hold as well during pregnancy.  Sunbathing:  Use a sunscreen, as skin burns easily during pregnancy.  Drink plenty of fluids; avoid over heating.  Tanning Beds:  Because their possible side effects are still unknown, tanning beds are not recommended.  Ultrasound Scans:  Routine ultrasounds are performed at approximately 20 weeks.  You will be able to see your baby's general anatomy an if you would like to know the gender this can usually be determined as well.  If it is questionable when you conceived you may also receive an ultrasound early in your pregnancy for dating purposes.  Otherwise ultrasound exams are not routinely performed unless there is a medical necessity.  Although you can request a scan we ask that you pay for it when conducted because insurance does not cover " patient request" scans.  Work: If your pregnancy proceeds without complications you may work until your due date, unless your physician or employer advises otherwise.  Round Ligament Pain/Pelvic  Discomfort:  Sharp, shooting pains not associated with bleeding are fairly common, usually occurring in the second trimester of pregnancy.  They tend to be worse when standing up or when you remain standing for long periods of time.  These are the result of pressure of certain pelvic ligaments called "round ligaments".  Rest, Tylenol and heat seem to be the most effective relief.  As the womb and fetus grow, they rise out of the pelvis and the discomfort improves.  Please notify the office if your pain seems different than that described.  It may represent a more serious condition.

## 2022-06-14 NOTE — Progress Notes (Signed)
  History:  [name] is a 21 y.o. F who presents to clinic today with desire to have a test for pregnancy. History of HIV. No bleeding or pelvic pain reported.   Past Medical History:  Diagnosis Date   Allergy    COVID-19    04/2021    Past Surgical History:  Procedure Laterality Date   FEMUR FRACTURE SURGERY      The following portions of the patient's history were reviewed and updated as appropriate: allergies, current medications, past family history, past medical history, past social history, past surgical history and problem list.   Review of Systems:  Pertinent items noted in HPI and remainder of comprehensive ROS otherwise negative.  Objective:  Physical Exam Ht 5\' 10"  (1.778 m)   Wt 212 lb 4.8 oz (96.3 kg)   BMI 30.46 kg/m  Physical Exam Constitutional: well developed, well nourished, no distress HEENT: normocephalic CV: normal rate Pulm: normal effort Abdomen: soft, NT Neuro: alert and oriented x 3 Skin: warm, dry Psych: affect normal  Labs and Imaging Results for orders placed or performed in visit on 06/14/22 (from the past 24 hour(s))  Pregnancy, urine POC     Status: Abnormal   Collection Time: 06/14/22  2:20 PM  Result Value Ref Range   Preg Test, Ur POSITIVE (A) NEGATIVE   No results found.  Assessment & Plan:  1. Positive pregnancy test 2. HIV affecting pregnancy, antepartum Continue Descovy and Ticavy, follow up with ID (was in [place] but referral sent to ID here at [facility]) Continue prenatal vitamins Pain and bleeding precautions reviewed Start prenatal care as recommended.   Approximately 15 minutes of total time was spent with this patient on history taking, coordination of care, education and documentation.   [facility], MD 06/14/2022 2:42 PM

## 2022-06-29 ENCOUNTER — Telehealth: Payer: Self-pay | Admitting: Family Medicine

## 2022-06-29 NOTE — Telephone Encounter (Signed)
[facility] and said they could not get in touch with this patient. They stated that she was called 4 times and left messages to call back.

## 2022-07-03 ENCOUNTER — Inpatient Hospital Stay (HOSPITAL_COMMUNITY)
Admission: AD | Admit: 2022-07-03 | Discharge: 2022-07-04 | Disposition: A | Discharge status: Home / Self Care | Payer: Medicaid Other | Attending: Obstetrics and Gynecology | Admitting: Obstetrics and Gynecology

## 2022-07-03 ENCOUNTER — Encounter (HOSPITAL_COMMUNITY): Payer: Self-pay | Admitting: *Deleted

## 2022-07-03 ENCOUNTER — Other Ambulatory Visit: Payer: Self-pay

## 2022-07-03 DIAGNOSIS — O26891 Other specified pregnancy related conditions, first trimester: Secondary | ICD-10-CM | POA: Diagnosis present

## 2022-07-03 DIAGNOSIS — O219 Vomiting of pregnancy, unspecified: Secondary | ICD-10-CM | POA: Insufficient documentation

## 2022-07-03 DIAGNOSIS — Z3A09 9 weeks gestation of pregnancy: Secondary | ICD-10-CM | POA: Diagnosis not present

## 2022-07-03 DIAGNOSIS — Z8616 Personal history of COVID-19: Secondary | ICD-10-CM | POA: Diagnosis not present

## 2022-07-03 DIAGNOSIS — E860 Dehydration: Secondary | ICD-10-CM | POA: Diagnosis not present

## 2022-07-03 DIAGNOSIS — O99281 Endocrine, nutritional and metabolic diseases complicating pregnancy, first trimester: Secondary | ICD-10-CM | POA: Insufficient documentation

## 2022-07-03 DIAGNOSIS — R109 Unspecified abdominal pain: Secondary | ICD-10-CM | POA: Insufficient documentation

## 2022-07-03 DIAGNOSIS — Z3A08 8 weeks gestation of pregnancy: Secondary | ICD-10-CM | POA: Diagnosis not present

## 2022-07-03 LAB — COMPREHENSIVE METABOLIC PANEL
ALT: 23 U/L (ref 0–44)
AST: 24 U/L (ref 15–41)
Albumin: 4.2 g/dL (ref 3.5–5.0)
Alkaline Phosphatase: 47 U/L (ref 38–126)
Anion gap: 8 (ref 5–15)
BUN: 7 mg/dL (ref 6–20)
CO2: 21 mmol/L — ABNORMAL LOW (ref 22–32)
Calcium: 9.6 mg/dL (ref 8.9–10.3)
Chloride: 107 mmol/L (ref 98–111)
Creatinine, Ser: 0.8 mg/dL (ref 0.44–1.00)
GFR, Estimated: >60 mL/min (ref >60)
Glucose, Bld: 87 mg/dL (ref 70–99)
Potassium: 3.7 mmol/L (ref 3.5–5.1)
Sodium: 136 mmol/L (ref 135–145)
Total Bilirubin: 1 mg/dL (ref 0.3–1.2)
Total Protein: 7.5 g/dL (ref 6.5–8.1)

## 2022-07-03 LAB — CBC
HCT: 37.1 % (ref 36.0–46.0)
Hemoglobin: 12.5 g/dL (ref 12.0–15.0)
MCH: 27 pg (ref 26.0–34.0)
MCHC: 33.7 g/dL (ref 30.0–36.0)
MCV: 80.1 fL (ref 80.0–100.0)
Platelets: 217 10*3/uL (ref 150–400)
RBC: 4.63 MIL/uL (ref 3.87–5.11)
RDW: 13.2 % (ref 11.5–15.5)
WBC: 7 10*3/uL (ref 4.0–10.5)
nRBC: 0 % (ref 0.0–0.2)

## 2022-07-03 MED ORDER — FAMOTIDINE IN NACL 20-0.9 MG/50ML-% IV SOLN
20.0000 mg | Freq: Once | INTRAVENOUS | Status: AC
  Administered 2022-07-03: 20 mg via INTRAVENOUS
  Filled 2022-07-03: qty 50

## 2022-07-03 MED ORDER — SCOPOLAMINE 1 MG/3DAYS TD PT72
1.0000 | MEDICATED_PATCH | Freq: Once | TRANSDERMAL | Status: DC
  Administered 2022-07-03: 1.5 mg via TRANSDERMAL
  Filled 2022-07-03: qty 1

## 2022-07-03 MED ORDER — LACTATED RINGERS IV SOLN: Freq: Once | INTRAVENOUS | Status: AC

## 2022-07-03 MED ORDER — SCOPOLAMINE 1 MG/3DAYS TD PT72: 1.0000 | MEDICATED_PATCH | TRANSDERMAL | Status: DC

## 2022-07-03 MED ORDER — SODIUM CHLORIDE 0.9 % IV SOLN
25.0000 mg | Freq: Once | INTRAVENOUS | Status: AC
  Administered 2022-07-03: 25 mg via INTRAVENOUS
  Filled 2022-07-03: qty 1

## 2022-07-03 NOTE — MAU Note (Signed)
Pt dosing and has not vomited since Triage

## 2022-07-03 NOTE — MAU Note (Signed)
.  Pam Jones is a 21 y.o. at Unknown here in [facility]U reporting n/v for 10 days but worse today. Some blood in emesis. No vag bleeding.  Some abdominal cramping. Lightheaded. Does not have nausea meds at home. Has first appt tomorrow at [facility] LMP: 6/7 Onset of complaint: 10days Pain score: 2 Vitals:   07/03/22 2138 07/03/22 2140  BP:  120/79  Pulse: 77   Resp:    Temp:    SpO2: 99%      FHT:n/a Lab orders placed from triage:   U/a

## 2022-07-03 NOTE — MAU Provider Note (Signed)
Chief Complaint: Emesis During Pregnancy, Dizziness, and Abdominal Pain   Event Date/Time   First Provider Initiated Contact with Patient 07/03/22 2149        SUBJECTIVE HPI: Pam Jones is a 21 y.o. G1P0 at [redacted]w[redacted]d by LMP who presents to maternity admissions reporting nausea and vomiting for 10 days.  Has no medicine at home to use. Office told her to come here.  Can keep some things down but not much. . She denies vaginal bleeding, vaginal itching/burning, urinary symptoms, h/a, dizziness,  or fever/chills.    Emesis  This is a recurrent problem. The current episode started 1 to 4 weeks ago. There has been no fever. Associated symptoms include abdominal pain and dizziness. Pertinent negatives include no chills, diarrhea, fever, headaches or myalgias. She has tried nothing for the symptoms.  Abdominal Pain This is a new problem. The current episode started today. The problem occurs intermittently. The quality of the pain is described as cramping. The pain does not radiate. Associated symptoms include vomiting. Pertinent negatives include no diarrhea, fever, headaches or myalgias. Nothing relieves the symptoms. Past treatments include nothing.   RN Note: Pam Jones is a 21 y.o. at Unknown here in MAU reporting n/v for 10 days but worse today. Some blood in emesis. No vag bleeding.  Some abdominal cramping. Lightheaded. Does not have nausea meds at home. Has first appt tomorrow at [facility] LMP: 6/7 Onset of complaint: 10days Pain score: 2   Past Medical History:  Diagnosis Date   COVID-19    04/2021   HIV (human immunodeficiency virus infection) (HCC)    Past Surgical History:  Procedure Laterality Date   FEMUR FRACTURE SURGERY     Social History   Socioeconomic History   Marital status: Single    Spouse name: Not on file   Number of children: Not on file   Years of education: Not on file   Highest education level: Not on file  Occupational History   Not on file   Tobacco Use   Smoking status: Never    Passive exposure: Yes   Smokeless tobacco: Never  Vaping Use   Vaping Use: Never used  Substance and Sexual Activity   Alcohol use: No   Drug use: No   Sexual activity: Yes    Birth control/protection: None  Other Topics Concern   Not on file  Social History Narrative   Not on file   Social Determinants of Health   Financial Resource Strain: Not on file  Food Insecurity: Not on file  Transportation Needs: Not on file  Physical Activity: Not on file  Stress: Not on file  Social Connections: Not on file  Intimate Partner Violence: Not on file   No current facility-administered medications on file prior to encounter.   Current Outpatient Medications on File Prior to Encounter  Medication Sig Dispense Refill   DESCOVY 200-25 MG tablet Take 1 tablet by mouth daily.     ipratropium (ATROVENT) 0.03 % nasal spray Place 2 sprays into the nose 2 (two) times daily. 30 mL 1   Prenatal Vit-Fe Fumarate-FA (PRENATAL VITAMINS PO) Take 1 tablet by mouth daily.     TIVICAY 50 MG tablet Take 50 mg by mouth daily.     Allergies  Allergen Reactions   Amoxicillin Hives   Banana Itching and Swelling   Kiwi Extract Swelling   Paxlovid [Nirmatrelvir-Ritonavir] Hives   Strawberry (Diagnostic) Swelling    I have reviewed patient's Past Medical Hx, Surgical  Hx, Family Hx, Social Hx, medications and allergies.   ROS:  Review of Systems  Constitutional:  Negative for chills and fever.  Gastrointestinal:  Positive for abdominal pain and vomiting. Negative for diarrhea.  Musculoskeletal:  Negative for myalgias.  Neurological:  Positive for dizziness. Negative for headaches.   Review of Systems  Other systems negative   Physical Exam  Physical Exam Patient Vitals for the past 24 hrs:  BP Temp Pulse Resp SpO2 Height Weight  07/03/22 2140 120/79 -- -- -- -- -- --  07/03/22 2138 -- -- 77 -- 99 % -- --  07/03/22 2133 -- 98 F (36.7 C) -- 18 -- 5'  10" (1.778 m) 91.6 kg   Constitutional: Well-developed, well-nourished female in no acute distress.  Cardiovascular: normal rate Respiratory: normal effort GI: Abd soft, non-tender. Pos BS x 4 MS: Extremities nontender, no edema, normal ROM Neurologic: Alert and oriented x 4.  GU: Neg CVAT.   LAB RESULTS Results for orders placed or performed during the hospital encounter of 07/03/22 (from the past 24 hour(s))  Comprehensive metabolic panel     Status: Abnormal   Collection Time: 07/03/22  9:53 PM  Result Value Ref Range   Sodium 136 135 - 145 mmol/L   Potassium 3.7 3.5 - 5.1 mmol/L   Chloride 107 98 - 111 mmol/L   CO2 21 (L) 22 - 32 mmol/L   Glucose, Bld 87 70 - 99 mg/dL   BUN 7 6 - 20 mg/dL   Creatinine, Ser 2.40 0.44 - 1.00 mg/dL   Calcium 9.6 8.9 - 97.3 mg/dL   Total Protein 7.5 6.5 - 8.1 g/dL   Albumin 4.2 3.5 - 5.0 g/dL   AST 24 15 - 41 U/L   ALT 23 0 - 44 U/L   Alkaline Phosphatase 47 38 - 126 U/L   Total Bilirubin 1.0 0.3 - 1.2 mg/dL   GFR, Estimated >53 >29 mL/min   Anion gap 8 5 - 15  CBC     Status: None   Collection Time: 07/03/22  9:53 PM  Result Value Ref Range   WBC 7.0 4.0 - 10.5 K/uL   RBC 4.63 3.87 - 5.11 MIL/uL   Hemoglobin 12.5 12.0 - 15.0 g/dL   HCT 92.4 26.8 - 34.1 %   MCV 80.1 80.0 - 100.0 fL   MCH 27.0 26.0 - 34.0 pg   MCHC 33.7 30.0 - 36.0 g/dL   RDW 96.2 22.9 - 79.8 %   Platelets 217 150 - 400 K/uL   nRBC 0.0 0.0 - 0.2 %     IMAGING No results found.  MAU Management/MDM: I have reviewed the triage vital signs and the nursing notes.   Pertinent labs & imaging results that were available during my care of the patient were reviewed by me and considered in my medical decision making (see chart for details).      I have reviewed her medical records including past results, notes and treatments.   Ordered IV fluids, Phenergan, Scopolamine, and Pepcid.   This bleeding/pain can represent a normal pregnancy with bleeding, spontaneous abortion  or even an ectopic which can be life-threatening.  The process as listed above helps to determine which of these is present.  Felt much better  after 2 liters of IV fluid and meds Discussed use of antiemetics at home, rx sent to pharmacy  ASSESSMENT Single IUP at [redacted]w[redacted]d Nausea and vomiting Dehydration  PLAN Discharge home Rx scopolamine for nausea Rx Diclegis for first  line nausea treatment Rx Phenergan prn if above doesn't help Advance diet as tolerated Pt stable at time of discharge. Encouraged to return here if she develops worsening of symptoms, increase in pain, fever, or other concerning symptoms.    Hansel Feinstein CNM, MSN Certified Nurse-Midwife 07/03/2022  9:49 PM

## 2022-07-04 DIAGNOSIS — Z3A09 9 weeks gestation of pregnancy: Secondary | ICD-10-CM | POA: Diagnosis not present

## 2022-07-04 DIAGNOSIS — E860 Dehydration: Secondary | ICD-10-CM

## 2022-07-04 DIAGNOSIS — O219 Vomiting of pregnancy, unspecified: Secondary | ICD-10-CM

## 2022-07-04 MED ORDER — DOXYLAMINE-PYRIDOXINE 10-10 MG PO TBEC: 2.0000 | DELAYED_RELEASE_TABLET | Freq: Every evening | ORAL | 5 refills | Status: DC | PRN

## 2022-07-04 MED ORDER — PROMETHAZINE HCL 25 MG PO TABS: 25.0000 mg | ORAL_TABLET | Freq: Four times a day (QID) | ORAL | 2 refills | Status: DC | PRN

## 2022-07-04 MED ORDER — SCOPOLAMINE 1 MG/3DAYS TD PT72
1.0000 | MEDICATED_PATCH | TRANSDERMAL | 12 refills | Status: DC
Start: 2022-07-04 — End: 2022-08-06

## 2022-07-04 NOTE — Progress Notes (Signed)
Written and verbal d/c instructions given and understanding voiced. 

## 2022-07-05 ENCOUNTER — Telehealth: Payer: BC Managed Care – PPO

## 2022-07-05 ENCOUNTER — Telehealth (INDEPENDENT_AMBULATORY_CARE_PROVIDER_SITE_OTHER): Payer: BC Managed Care – PPO | Admitting: *Deleted

## 2022-07-05 DIAGNOSIS — B20 Human immunodeficiency virus [HIV] disease: Secondary | ICD-10-CM

## 2022-07-05 DIAGNOSIS — O0990 Supervision of high risk pregnancy, unspecified, unspecified trimester: Secondary | ICD-10-CM | POA: Insufficient documentation

## 2022-07-05 DIAGNOSIS — O99210 Obesity complicating pregnancy, unspecified trimester: Secondary | ICD-10-CM | POA: Insufficient documentation

## 2022-07-05 DIAGNOSIS — O98719 Human immunodeficiency virus [HIV] disease complicating pregnancy, unspecified trimester: Secondary | ICD-10-CM

## 2022-07-05 DIAGNOSIS — Z3A00 Weeks of gestation of pregnancy not specified: Secondary | ICD-10-CM

## 2022-07-05 NOTE — Patient Instructions (Signed)
?  At our [facility], we work as an integrated team, providing care to address both physical and emotional health. Your medical provider may refer you to see our Behavioral Health Clinician (BHC) on the same day you see your medical provider, as availability permits; often scheduled virtually at your convenience.  ?Our BHC is available to all patients, visits generally last between 20-30 minutes, but can be longer or shorter, depending on patient need. The BHC offers help with stress management, coping with symptoms of depression and anxiety, major life changes , sleep issues, changing risky behavior, grief and loss, life stress, working on personal life goals, and  behavioral health issues, as these all affect your overall health and wellness.  ?The BHC is NOT available for the following: FMLA paperwork, court-ordered evaluations, specialty assessments (custody or disability), letters to employers, or obtaining certification for an emotional support animal. The BHC does not provide long-term therapy. ?You have the right to refuse integrated behavioral health services, or to reschedule to see the BHC at a later date.  ?Confidentiality exception: If it is suspected that a child or disabled adult is being abused or neglected, we are required by law to report that to either Child Protective Services or Adult Protective Services.  ?If you have a diagnosis of Bipolar affective disorder, Schizophrenia, or recurrent Major depressive disorder, we will recommend that you establish care with a psychiatrist, as these are lifelong, chronic conditions, and we want your overall emotional health and medications to be more closely monitored. If you anticipate needing extended maternity leave due to mental health issues postpartum, it it recommended you inform your medical provider, so we can put in a referral to a psychiatrist as soon as possible. The BHC is unable to recommend an extended maternity leave for mental  health issues. ?Your medical provider or BHC may refer you to a therapist for ongoing, traditional therapy, or to a psychiatrist, for medication management, if it would benefit your overall health. ?Depending on your insurance, you may have a copay or be charged a deductible, depending on your insurance, to see the BHC. If you are uninsured, it is recommended that you apply for financial assistance. (Forms may be requested at the front desk for in-person visits, via MyChart, or request a form during a virtual visit).  ?If you see the BHC more than 6 times, you will have to complete a comprehensive clinical assessment interview with the BHC to resume integrated services.  ?For virtual visits with the BHC, you must be physically in the state of [place] at the time of the visit. For example, if you live in [place], you will have to do an in-person visit with the BHC, and your out-of-state insurance may not cover behavioral health services in [place]. If you are going out of the state or country for any reason, the BHC may see you virtually when you return to [place], but not while you are physically outside of [place].  ?  ?Here is a link to the Pregnancy Navigators . Please ?Fill out prior to your New OB appointment.  ? ?English Link: [email or web address].[email or web address] ? ?Spanish Link: [email or web address].[email or web address]?[email or web address]  ?[email or web address] is a website with information to help you prepare for Labor and delivery, patient information, visitor information and more.   ?

## 2022-07-05 NOTE — Progress Notes (Signed)
New OB Intake  I connected with  Pam Jones on 07/05/22 at  9:15 AM EDT by MyChart Video Visit and verified that I am speaking with the correct person using two identifiers. Nurse is located at [facility] and pt is located at hospital with Mother.  I discussed the limitations, risks, security and privacy concerns of performing an evaluation and management service by telephone and the availability of in person appointments. I also discussed with the patient that there may be a patient responsible charge related to this service. The patient expressed understanding and agreed to proceed.  I explained I am completing New OB Intake today. We discussed her EDD of 02/06/23 that is based on LMP of 05/02/22. Pt is G1/P0. I reviewed her allergies, medications, Medical/Surgical/OB history, and appropriate screenings. I informed her of [facility] services. [facility] information placed in AVS. Based on history, this is a/an  pregnancy complicated by HIV  .   Patient Active Problem List   Diagnosis Date Noted   Supervision of high risk pregnancy, antepartum 07/05/2022   HIV (human immunodeficiency virus infection) (HCC)    Obesity in pregnancy     Concerns addressed today  Delivery Plans Plans to deliver at [facility]. Patient given information for [facility] Healthy Baby website for more information about [facility]. Patient is not interested in water birth because she is aware she is likely not eligible due to her dx.    MyChart/Babyscripts MyChart access verified. I explained pt will have some visits in office and some virtually. Babyscripts instructions given and order placed.   Blood Pressure Cuff/Weight Scale Patient has private insurance; instructed to purchase blood pressure cuff and bring to first prenatal appt. Explained after first prenatal appt pt will check weekly and document in Babyscripts. Patient does have weight scale.   Anatomy US Explained first scheduled [place] will be around 19  weeks. Anatomy US scheduled for 09/12/22 at 0900. Pt notified to arrive at 0845.  Labs Discussed Avelina Laine genetic screening with patient. She declines Panorama but would like Horizon drawn at new OB visit. Routine prenatal labs needed.  Covid Vaccine Patient has not had the covid vaccine.   Is patient a CenteringPregnancy candidate?  Accepted Centering Patient" indicated on sticky note   Is patient a Mom+Baby Combined Care candidate?  Declined     Social Determinants of Health Food Insecurity: Patient expresses food insecurity. Food Market information given to patient; explained patient may visit at the end of first OB appointment. WIC Referral: Patient is not interested in referral to [facility]. She already has appointment Transportation: Patient denies transportation needs. Childcare: Discussed no children allowed at ultrasound appointments. Offered childcare services; patient declines childcare services at this time.  First visit review I reviewed new OB appt with pt. I explained she will have a provider visit that includes labs, exam. Explained pt will be seen by Dr. Debroah Loop at first visit; encounter routed to appropriate provider. Explained that patient will be seen by pregnancy navigator following visit with provider.   Nancy Fetter 07/05/2022  9:55 AM

## 2022-07-06 ENCOUNTER — Ambulatory Visit (INDEPENDENT_AMBULATORY_CARE_PROVIDER_SITE_OTHER): Payer: Medicaid Other | Admitting: Infectious Diseases

## 2022-07-06 ENCOUNTER — Other Ambulatory Visit (HOSPITAL_COMMUNITY)
Admission: RE | Admit: 2022-07-06 | Discharge: 2022-07-06 | Disposition: A | Discharge status: Home / Self Care | Payer: Medicaid Other | Source: Ambulatory Visit | Attending: Infectious Diseases | Admitting: Infectious Diseases

## 2022-07-06 ENCOUNTER — Telehealth: Payer: Self-pay | Admitting: Family Medicine

## 2022-07-06 ENCOUNTER — Ambulatory Visit (INDEPENDENT_AMBULATORY_CARE_PROVIDER_SITE_OTHER): Payer: BC Managed Care – PPO | Admitting: Pharmacist

## 2022-07-06 ENCOUNTER — Other Ambulatory Visit (HOSPITAL_COMMUNITY): Payer: Self-pay

## 2022-07-06 ENCOUNTER — Encounter: Payer: Self-pay | Admitting: *Deleted

## 2022-07-06 ENCOUNTER — Encounter: Payer: Self-pay | Admitting: Infectious Diseases

## 2022-07-06 ENCOUNTER — Other Ambulatory Visit: Payer: Self-pay

## 2022-07-06 ENCOUNTER — Telehealth: Payer: Self-pay

## 2022-07-06 VITALS — BP 131/78 | HR 75 | Temp 99.4°F | Resp 16 | Wt 206.0 lb

## 2022-07-06 DIAGNOSIS — Z21 Asymptomatic human immunodeficiency virus [HIV] infection status: Secondary | ICD-10-CM | POA: Insufficient documentation

## 2022-07-06 DIAGNOSIS — O0990 Supervision of high risk pregnancy, unspecified, unspecified trimester: Secondary | ICD-10-CM | POA: Diagnosis not present

## 2022-07-06 DIAGNOSIS — Z3490 Encounter for supervision of normal pregnancy, unspecified, unspecified trimester: Secondary | ICD-10-CM

## 2022-07-06 DIAGNOSIS — O98719 Human immunodeficiency virus [HIV] disease complicating pregnancy, unspecified trimester: Secondary | ICD-10-CM | POA: Diagnosis not present

## 2022-07-06 MED ORDER — TIVICAY 50 MG PO TABS: 50.0000 mg | ORAL_TABLET | Freq: Every day | ORAL | 11 refills | Status: DC

## 2022-07-06 MED ORDER — DESCOVY 200-25 MG PO TABS: 1.0000 | ORAL_TABLET | Freq: Every day | ORAL | 11 refills | Status: DC

## 2022-07-06 NOTE — Telephone Encounter (Signed)
Patient requesting a sooner Ultrasound

## 2022-07-06 NOTE — Progress Notes (Signed)
HPI: Pam Jones is a 21 y.o. female who presents to the [facility] clinic today to transfer care for HIV.  Patient Active Problem List   Diagnosis Date Noted   Supervision of high risk pregnancy, antepartum 07/05/2022   HIV (human immunodeficiency virus infection) ([place])    Obesity in pregnancy     Patient's Medications  New Prescriptions   No medications on file  Previous Medications   DOXYLAMINE-PYRIDOXINE (DICLEGIS) 10-10 MG TBEC    Take 2 tablets by mouth at bedtime as needed.   IPRATROPIUM (ATROVENT) 0.03 % NASAL SPRAY    Place 2 sprays into the nose 2 (two) times daily.   PRENATAL VIT-FE FUMARATE-FA (PRENATAL VITAMINS PO)    Take 1 tablet by mouth daily.   PROMETHAZINE (PHENERGAN) 25 MG TABLET    Take 1 tablet (25 mg total) by mouth every 6 (six) hours as needed for nausea or vomiting.   SCOPOLAMINE (TRANSDERM-SCOP) 1 MG/3DAYS    Place 1 patch (1.5 mg total) onto the skin every 3 (three) days.  Modified Medications   Modified Medication Previous Medication   DESCOVY 200-25 MG TABLET DESCOVY 200-25 MG tablet      Take 1 tablet by mouth daily.    Take 1 tablet by mouth daily.   TIVICAY 50 MG TABLET TIVICAY 50 MG tablet      Take 1 tablet (50 mg total) by mouth daily.    Take 50 mg by mouth daily.  Discontinued Medications   No medications on file    Allergies: Allergies  Allergen Reactions   Amoxicillin Hives   Banana Itching and Swelling   Kiwi Extract Swelling   Paxlovid [Nirmatrelvir-Ritonavir] Hives   Strawberry (Diagnostic) Swelling    Past Medical History: Past Medical History:  Diagnosis Date   COVID-19    04/2021   HIV (human immunodeficiency virus infection) ([place])    Hypertension     Social History: Social History   Socioeconomic History   Marital status: Single    Spouse name: Not on file   Number of children: Not on file   Years of education: Not on file   Highest education level: Not on file  Occupational History   Not on file  Tobacco Use    Smoking status: Never    Passive exposure: Yes   Smokeless tobacco: Never  Vaping Use   Vaping Use: Never used  Substance and Sexual Activity   Alcohol use: Not Currently    Comment: occasionally before pregnancy   Drug use: Not Currently    Types: Marijuana    Comment: last used before found out pregnancy   Sexual activity: Yes    Birth control/protection: None  Other Topics Concern   Not on file  Social History Narrative   Not on file   Social Determinants of Health   Financial Resource Strain: Not on file  Food Insecurity: Food Insecurity Present (07/05/2022)   Hunger Vital Sign    Worried About Running Out of Food in the Last Year: Sometimes true    Ran Out of Food in the Last Year: Sometimes true  Transportation Needs: No Transportation Needs (07/05/2022)   PRAPARE - Hydrologist (Medical): No    Lack of Transportation (Non-Medical): No  Physical Activity: Not on file  Stress: Not on file  Social Connections: Not on file    Labs: No results found for: "HIV1RNAQUANT", "HIV1RNAVL", "CD4TABS"  RPR and STI No results found for: "LABRPR", "RPRTITER"  No data to display          Hepatitis B No results found for: "HEPBSAB", "HEPBSAG", "HEPBCAB" Hepatitis C No results found for: "HEPCAB", "HCVRNAPCRQN" Hepatitis A No results found for: "HAV" Lipids: Lab Results  Component Value Date   CHOL 143 06/25/2012   TRIG 67 06/25/2012   HDL 51 06/25/2012   CHOLHDL 2.8 06/25/2012   VLDL 13 06/25/2012   LDLCALC 79 06/25/2012    Current HIV Regimen: Descovy/Tivicay  Assessment: Torrey is here today to transfer care with [facility] for chronic HIV infection. Met with patient today and explained the clinic's pharmacy services.  Patient recently switched from [place] to Tivicay/Descovy as she is pregnant and has been tolerating the regimen well without any issues.  No side effects or missed doses noted. Gave patient my card and told  her to call me with any issues/questions/concerns in the future. She states her copay right now is ~$50/month. Butch Penny was able to complete copay cards for both Tivicay and Descovy, and these were provided to the patient today. She also met with Sharyn Lull today for supplemental assistance through [employer]. 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   Plan: Continue Tivicay/Descovy  Alfonse Spruce, PharmD, CPP, [place] Clinical Pharmacist Practitioner Infectious Diseases [place] 07/06/2022, 4:21 PM

## 2022-07-06 NOTE — Telephone Encounter (Signed)
[facility] Patient Advocate Encounter  Descovy [facility] Copay Card    Tivicay V[employer]VConnect Copay Card        Clearance Coots , CPhT Specialty Pharmacy Patient [facility] for Infectious Disease Phone: [phone] Fax:  [phone]

## 2022-07-06 NOTE — Patient Instructions (Signed)
Lovely to meet you - I look forward to working with you   Will continue the tivicay and descovy once a day together like you have been.   Will let you know what your blood work and other tests show once available   Would like to see you back in 3 months please.   Continue to separate your other medication from the prenatal vitamins.   I would recommend you get the flu shot once available

## 2022-07-06 NOTE — Progress Notes (Signed)
Name: Pam Jones  DOB: August 20, 2001 MRN: [id number] PCP: Patient, No Pcp Per    Brief Narrative:  LEANZA Jones is a 21 y.o. female with HIV diagnosed in May of 2023.  CD4 nadir 350 HIV Risk: sexual History of OIs: none Intake Labs: Hep B sAg (-), sAb (), cAb (); Hep A (-), Hep C (-) Quantiferon (-) HLA B*5701 () G6PD: ()   Previous Regimens: Tivicay + Descovy (during pregnancy)   Genotypes: 2023 - VL too small to detect   Subjective:   Chief Complaint  Patient presents with  . New Patient (Initial Visit)    B20      HPI: Pam Jones is here to establish care for HIV. Received a positive test in May 2023 with other routine sexual health screenings. Sexual transmission. She was started initially on STR with Biktarvy and switched to [place] after discovered she was pregnant.  Raised in [place] and vaccinated accordingly. No other health conditions to note aside from the fact she is currently pregnant with her first child. She is [redacted]w[redacted]d- [facility] [employer] where she has established care and has first OB visit soon. She reports food insecurity and would like to accept any help we can offer. [facility] No transportation needs currently. Has questions about pregnancy as it relates to HIV.      07/06/2022    2:56 PM  Depression screen PHQ 2/9  Decreased Interest 1  Down, Depressed, Hopeless 1  PHQ - 2 Score 2    Review of Systems  Constitutional:  Negative for chills, fever, malaise/fatigue and weight loss.  HENT:  Negative for sore throat.        No dental problems  Respiratory:  Negative for cough and sputum production.   Cardiovascular:  Negative for chest pain and leg swelling.  Gastrointestinal:  Negative for abdominal pain, diarrhea and vomiting.  Genitourinary:  Negative for dysuria and flank pain.  Musculoskeletal:  Negative for joint pain, myalgias and neck pain.  Skin:  Negative for rash.   Neurological:  Negative for dizziness, tingling and headaches.  Psychiatric/Behavioral:  Negative for depression and substance abuse. The patient is not nervous/anxious and does not have insomnia.     Past Medical History:  Diagnosis Date  . COVID-19    04/2021  . [place]     Outpatient Medications Prior to Visit  Medication Sig Dispense Refill  . DESCOVY 200-25 MG tablet Take 1 tablet by mouth daily.    . Doxylamine-Pyridoxine (DICLEGIS) 10-10 MG TBEC Take 2 tablets by mouth at bedtime as needed. 60 tablet 5  . Prenatal Vit-Fe Fumarate-FA (PRENATAL VITAMINS PO) Take 1 tablet by mouth daily.    . promethazine (PHENERGAN) 25 MG tablet Take 1 tablet (25 mg total) by mouth every 6 (six) hours as needed for nausea or vomiting. 30 tablet 2  . scopolamine (TRANSDERM-SCOP) 1 MG/3DAYS Place 1 patch (1.5 mg total) onto the skin every 3 (three) days. 10 patch 12  . TIVICAY 50 MG tablet Take 50 mg by mouth daily.    .Marland Kitchenipratropium (ATROVENT) 0.03 % nasal spray Place 2 sprays into the nose 2 (two) times daily. (Patient not taking: Reported on 07/06/2022) 30 mL 1   No facility-administered medications prior to visit.     Allergies  Allergen Reactions  . Amoxicillin Hives  . Banana Itching and Swelling  . Kiwi Extract Swelling  .  Paxlovid [Nirmatrelvir-Ritonavir] Hives  . Strawberry (Diagnostic) Swelling    Social History   Tobacco Use  . Smoking status: Never    Passive exposure: Yes  . Smokeless tobacco: Never  Vaping Use  . Vaping Use: Never used  Substance Use Topics  . Alcohol use: Not Currently    Comment: occasionally before pregnancy  . Drug use: Not Currently    Types: Marijuana    Comment: last used before found out pregnancy    Family History  Problem Relation Age of Onset  . Heart disease Mother   . Hypertension Mother   . Healthy Mother   . Rheumatic fever Mother   . Hypertension Father   . Hypertension Paternal  Grandmother   . Diabetes Paternal Grandmother     Social History   Substance and Sexual Activity  Sexual Activity Yes  . Birth control/protection: None     Objective:   Vitals:   07/06/22 1453  BP: 131/78  Pulse: 75  Resp: 16  Temp: 99.4 F (37.4 C)  TempSrc: Temporal  SpO2: 100%  Weight: 206 lb (93.4 kg)   Body mass index is 29.56 kg/m.   Physical Exam Vitals reviewed.  Constitutional:      Appearance: She is well-developed.     Comments: Seated comfortably in chair.   HENT:     Mouth/Throat:     Mouth: No oral lesions.     Dentition: Normal dentition. No dental abscesses.     Pharynx: No oropharyngeal exudate.  Cardiovascular:     Rate and Rhythm: Normal rate and regular rhythm.     Heart sounds: Normal heart sounds.  Pulmonary:     Effort: Pulmonary effort is normal.     Breath sounds: Normal breath sounds.  Abdominal:     General: There is no distension.     Palpations: Abdomen is soft.     Tenderness: There is no abdominal tenderness.  Lymphadenopathy:     Cervical: No cervical adenopathy.  Skin:    General: Skin is warm and dry.     Findings: No rash.  Neurological:     Mental Status: She is alert and oriented to person, place, and time.  Psychiatric:        Judgment: Judgment normal.    Lab Results Lab Results  Component Value Date   WBC 7.0 07/03/2022   HGB 12.5 07/03/2022   HCT 37.1 07/03/2022   MCV 80.1 07/03/2022   PLT 217 07/03/2022    Lab Results  Component Value Date   CREATININE 0.80 07/03/2022   BUN 7 07/03/2022   NA 136 07/03/2022   K 3.7 07/03/2022   CL 107 07/03/2022   CO2 21 (L) 07/03/2022    Lab Results  Component Value Date   ALT 23 07/03/2022   AST 24 07/03/2022   ALKPHOS 47 07/03/2022   BILITOT 1.0 07/03/2022    Lab Results  Component Value Date   CHOL 143 06/25/2012   HDL 51 06/25/2012   LDLCALC 79 06/25/2012   TRIG 67 06/25/2012   CHOLHDL 2.8 06/25/2012   No results found for: "HIV1RNAQUANT",  "HIV1RNAVL", "CD4TABS"   Assessment & Plan:   Problem List Items Addressed This Visit   None   Janene Madeira, MSN, NP-C [facility] Pager: [phone] Office: [phone]  07/06/22  3:04 PM

## 2022-07-09 ENCOUNTER — Telehealth: Payer: Self-pay

## 2022-07-09 LAB — CYTOLOGY, (ORAL, ANAL, URETHRAL) ANCILLARY ONLY
Chlamydia: NEGATIVE
Comment: NEGATIVE
Comment: NORMAL
Neisseria Gonorrhea: NEGATIVE

## 2022-07-09 LAB — URINE CYTOLOGY ANCILLARY ONLY
Chlamydia: NEGATIVE
Comment: NEGATIVE
Comment: NEGATIVE
Comment: NORMAL
Neisseria Gonorrhea: NEGATIVE
Trichomonas: NEGATIVE

## 2022-07-09 NOTE — Telephone Encounter (Signed)
Pt called front office to request ultrasound appt. States she read on a "pamphlet" that she needs ultrasound to determine location of pregnancy. Front office reviewed with RN who states pt should be seen at [facility] for any bleeding or pain, otherwise she may follow up at new OB appt to discuss further.

## 2022-07-09 NOTE — Telephone Encounter (Signed)
Called pt; pt states she would like transvaginal US to confirm location of pregnancy, if dates are correct, and if baby is healthy. Reviewed that this is not indicated unless she is having vaginal bleeding or abdominal pain; pt does not endorse these symptoms. Reviewed availability of [facility] for any future vaginal bleeding and abdominal pain. Reviewed menstrual period history. Pt reports PCOS diagnosis. Has not skipped period in 3 years since beginning OCPs. Pt reports cycle ranges from 17 to 30 days. Explained I will review with provider regarding need for ultrasound.

## 2022-07-10 LAB — HIV-1 RNA QUANT-NO REFLEX-BLD
HIV 1 RNA Quant: 21 Copies/mL — ABNORMAL HIGH
HIV-1 RNA Quant, Log: 1.33 Log cps/mL — ABNORMAL HIGH

## 2022-07-10 LAB — T-HELPER CELLS (CD4) COUNT (NOT AT ARMC)
Absolute CD4: 341 cells/uL — ABNORMAL LOW (ref 490–1740)
CD4 T Helper %: 38 % (ref 30–61)
Total lymphocyte count: 906 cells/uL (ref 850–3900)

## 2022-07-10 LAB — QUANTIFERON-TB GOLD PLUS
Mitogen-NIL: 6.23 IU/mL
NIL: 0.04 IU/mL
QuantiFERON-TB Gold Plus: NEGATIVE
TB1-NIL: 0 IU/mL
TB2-NIL: 0 IU/mL

## 2022-07-10 LAB — RPR: RPR Ser Ql: NONREACTIVE

## 2022-07-11 NOTE — Telephone Encounter (Signed)
Approval received from Pam Gens, MD for dating ultrasound. Called pt to schedule. Will return 07/17/22 for [place].

## 2022-07-13 NOTE — Assessment & Plan Note (Signed)
New patient here to establish for HIV care.  Recently diagnosed in May of this year and has been on treatment since receiving test results from HD at [facility].  She has been taking Tivicay and Descovy and doing well on this without any significant side effects when taking with food.   I discussed with [name] treatment options/side effects, benefits of treatment and long-term outcomes. I discussed how HIV is transmitted and the process of untreated HIV including increased risk for opportunistic infections, cancer, dementia and renal failure. Patient was counseled on routine HIV care including medication adherence, blood monitoring, necessary vaccines and follow up visits. Counseled regarding safe sex practices including: condom use, partner disclosure, limiting partners. Patient spent time talking with our pharmacist team regarding successful practices of ART and understands to reach out to our clinic in the future with questions.   Will continue Tivicay and Descovy for pregnancy recommended regimen. Insured with Medicaid.   General introduction to our clinic and integrated services. Food insecurity expressed - connected to [facility] for assistance. Also mentioned food pantry at [employer] for [employer].  Dental referral placed today for [facility]. Information to schedule appointment completed today.   Will hold on routine vaccinations until post-partum period.  Would recommend flu shot and TDap ~28 weeks.   Return in about 3 months (around 10/06/2022).

## 2022-07-16 ENCOUNTER — Telehealth: Payer: Self-pay | Admitting: Family Medicine

## 2022-07-16 MED ORDER — BLOOD PRESSURE KIT DEVI: 1.0000 | 0 refills | Status: DC

## 2022-07-16 NOTE — Telephone Encounter (Signed)
Rx for BP cuff sent to [facility]. Call placed to pt. Spoke with pt. Pt aware and verbalized understanding.  Anastashia Westerfeld,RNC

## 2022-07-16 NOTE — Telephone Encounter (Signed)
Patient requesting a Rx for a Blood Pressure Cuff,

## 2022-07-17 ENCOUNTER — Ambulatory Visit (INDEPENDENT_AMBULATORY_CARE_PROVIDER_SITE_OTHER): Payer: Medicaid Other

## 2022-07-17 ENCOUNTER — Other Ambulatory Visit: Payer: Self-pay | Admitting: Family Medicine

## 2022-07-17 DIAGNOSIS — Z3490 Encounter for supervision of normal pregnancy, unspecified, unspecified trimester: Secondary | ICD-10-CM

## 2022-07-18 ENCOUNTER — Encounter: Payer: Self-pay | Admitting: Infectious Diseases

## 2022-07-18 DIAGNOSIS — O0990 Supervision of high risk pregnancy, unspecified, unspecified trimester: Secondary | ICD-10-CM | POA: Diagnosis not present

## 2022-07-18 NOTE — Assessment & Plan Note (Signed)
Has upcoming appt with OB team for new pregnancy. She is still in first trimester. Taking prenatal vitamin, taking care to separate from HIV medication.  Briefly discussed risk for vertical transmission with mom being undetectable throughout pregnancy < 1%. She is very motivated to continue taking her medications and keeping herself and baby healthy.  Can proceed with vaginal delivery from ID perspective as long as VL stays low.  Changes to breastfeeding recommendations that were released this year as well - we can talk more about this after we see how she does on medications.

## 2022-07-20 ENCOUNTER — Other Ambulatory Visit: Payer: Self-pay

## 2022-07-20 ENCOUNTER — Encounter: Payer: Self-pay | Admitting: Obstetrics & Gynecology

## 2022-07-20 ENCOUNTER — Ambulatory Visit (INDEPENDENT_AMBULATORY_CARE_PROVIDER_SITE_OTHER): Payer: Medicaid Other | Admitting: Obstetrics & Gynecology

## 2022-07-20 ENCOUNTER — Other Ambulatory Visit (HOSPITAL_COMMUNITY)
Admission: RE | Admit: 2022-07-20 | Discharge: 2022-07-20 | Disposition: A | Discharge status: Home / Self Care | Payer: Medicaid Other | Source: Ambulatory Visit | Attending: Obstetrics & Gynecology | Admitting: Obstetrics & Gynecology

## 2022-07-20 VITALS — BP 135/69 | HR 91 | Wt 209.8 lb

## 2022-07-20 DIAGNOSIS — O99211 Obesity complicating pregnancy, first trimester: Secondary | ICD-10-CM

## 2022-07-20 DIAGNOSIS — Z3A11 11 weeks gestation of pregnancy: Secondary | ICD-10-CM

## 2022-07-20 DIAGNOSIS — O0990 Supervision of high risk pregnancy, unspecified, unspecified trimester: Secondary | ICD-10-CM | POA: Insufficient documentation

## 2022-07-20 DIAGNOSIS — O99210 Obesity complicating pregnancy, unspecified trimester: Secondary | ICD-10-CM

## 2022-07-20 DIAGNOSIS — O0991 Supervision of high risk pregnancy, unspecified, first trimester: Secondary | ICD-10-CM

## 2022-07-20 DIAGNOSIS — B20 Human immunodeficiency virus [HIV] disease: Secondary | ICD-10-CM

## 2022-07-20 DIAGNOSIS — R87612 Low grade squamous intraepithelial lesion on cytologic smear of cervix (LGSIL): Secondary | ICD-10-CM

## 2022-07-20 LAB — HEPATITIS C ANTIBODY: HCV Ab: NEGATIVE

## 2022-07-20 LAB — OB RESULTS CONSOLE HIV ANTIBODY (ROUTINE TESTING): HIV: REACTIVE

## 2022-07-20 LAB — OB RESULTS CONSOLE GC/CHLAMYDIA
Chlamydia: NEGATIVE
Neisseria Gonorrhea: NEGATIVE

## 2022-07-20 LAB — OB RESULTS CONSOLE HEPATITIS B SURFACE ANTIGEN: Hepatitis B Surface Ag: NEGATIVE

## 2022-07-20 LAB — OB RESULTS CONSOLE RUBELLA ANTIBODY, IGM: Rubella: IMMUNE

## 2022-07-20 LAB — OB RESULTS CONSOLE RPR: RPR: NONREACTIVE

## 2022-07-20 NOTE — Progress Notes (Signed)
  Subjective:NOB, had limited US 8/22    Pam Jones is a G1P0 [redacted]w[redacted]d being seen today for her first obstetrical visit.  Her obstetrical history is significant for  HIV infection . Patient does not intend to breast feed. Pregnancy history fully reviewed.  Patient reports nausea.  Vitals:   07/20/22 0915  BP: 135/69  Pulse: 91  Weight: 209 lb 12.8 oz (95.2 kg)    HISTORY: OB History  Gravida Para Term Preterm AB Living  1            SAB IAB Ectopic Multiple Live Births               # Outcome Date GA Lbr Len/2nd Weight Sex Delivery Anes PTL Lv  1 Current            Past Medical History:  Diagnosis Date   COVID-19    04/2021   HIV (human immunodeficiency virus infection) (HCC) 03/2022   Hypertension    Past Surgical History:  Procedure Laterality Date   FEMUR FRACTURE SURGERY     Family History  Problem Relation Age of Onset   Heart disease Mother    Hypertension Mother    Healthy Mother    Rheumatic fever Mother    Hypertension Father    Hypertension Paternal Grandmother    Diabetes Paternal Grandmother      Exam    Uterus:   12 week  Pelvic Exam:    Perineum: No Hemorrhoids   Vulva: normal   Vagina:  normal mucosa   pH:    Cervix: no lesions   Adnexa: normal adnexa   Bony Pelvis: average  System: Breast:  normal appearance, no masses or tenderness   Skin: normal coloration and turgor, no rashes    Neurologic: oriented, normal mood   Extremities: normal strength, tone, and muscle mass   HEENT PERRLA   Mouth/Teeth mucous membranes moist, pharynx normal without lesions   Neck supple   Cardiovascular: regular rate and rhythm, no murmurs or gallops   Respiratory:  appears well, vitals normal, no respiratory distress, acyanotic, normal RR, neck free of mass or lymphadenopathy, chest clear, no wheezing, crepitations, rhonchi, normal symmetric air entry   Abdomen: soft, non-tender; bowel sounds normal; no masses,  no organomegaly   Urinary: urethral  meatus normal      Assessment:    Pregnancy: G1P0 Patient Active Problem List   Diagnosis Date Noted   Supervision of high risk pregnancy, antepartum 07/05/2022   HIV (human immunodeficiency virus infection) (HCC)    Obesity in pregnancy         Plan:     Initial labs drawn. Prenatal vitamins. Problem list reviewed and updated. Genetic Screening discussed : ordered.  Ultrasound discussed; fetal survey: ordered.  Follow up in 4 weeks. 50% of 30 min visit spent on counseling and coordination of care.  HIV followed by ID   Scheryl Darter 07/20/2022

## 2022-07-21 IMAGING — CR DG CHEST 2V
2 series · 2 of 2 positions shown · non-contrast
Comparison: None Available.

CLINICAL DATA: Chest pain.

EXAM:
CHEST - 2 VIEW

[chest pa]
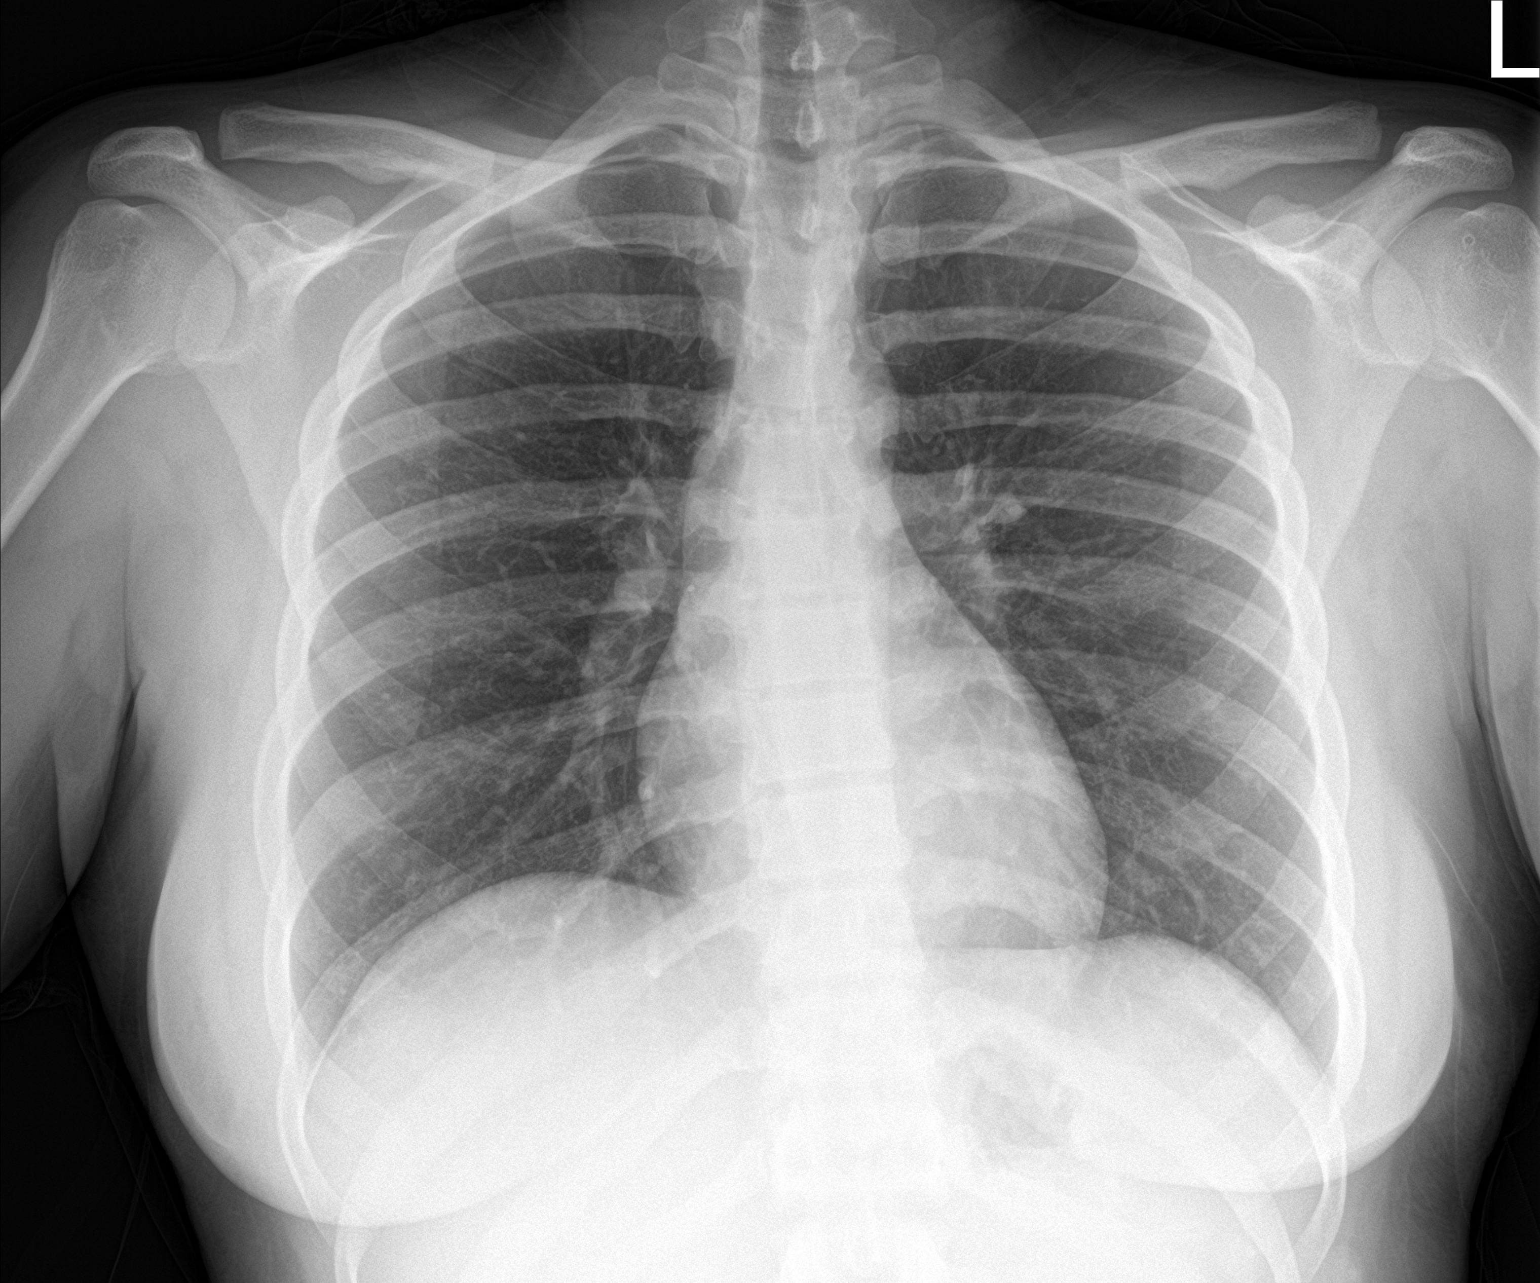

[chest lat]
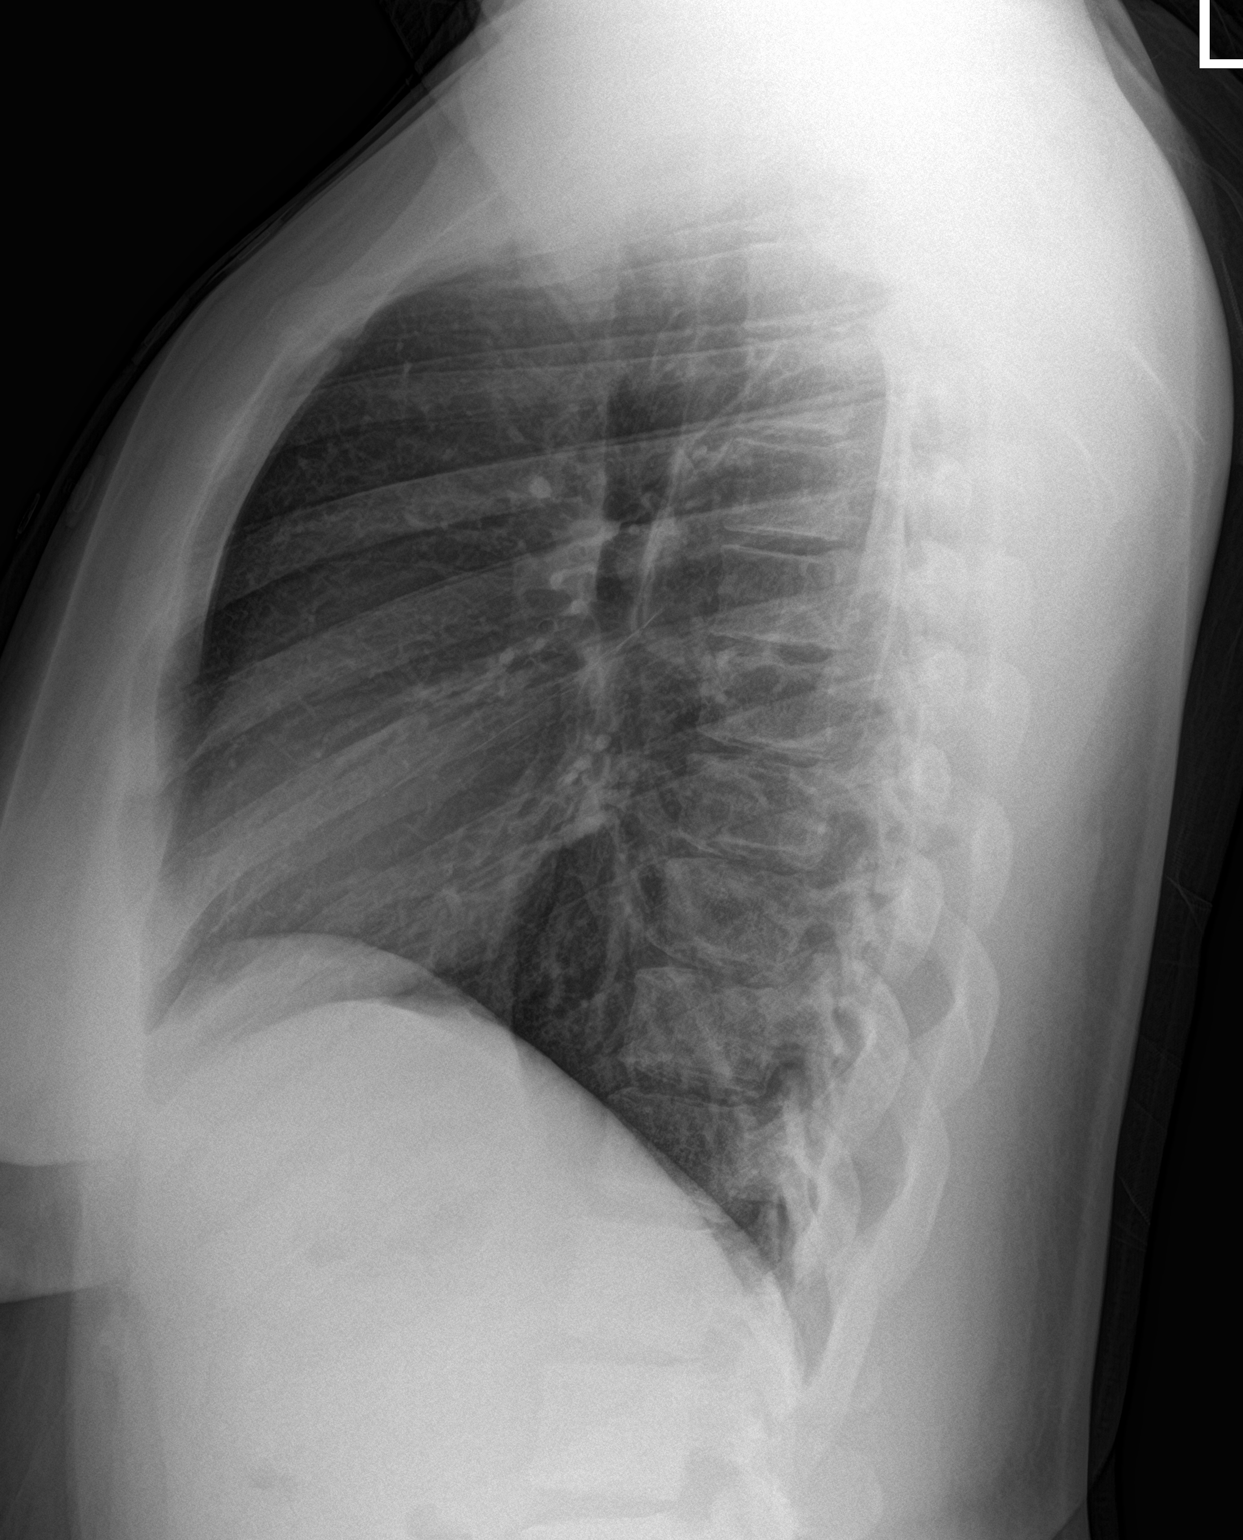

[2 of 2 positions shown; findings below may reference images not displayed]

FINDINGS: The cardiomediastinal contours are normal. The lungs are clear.
Pulmonary vasculature is normal. No consolidation, pleural effusion,
or pneumothorax. No acute osseous abnormalities are seen.
IMPRESSION: Negative radiographs of the chest.

## 2022-07-23 ENCOUNTER — Telehealth: Payer: Self-pay

## 2022-07-23 LAB — URINE CULTURE, OB REFLEX

## 2022-07-23 LAB — CULTURE, OB URINE

## 2022-07-23 NOTE — Telephone Encounter (Signed)
Called patient to discuss elevated BP input in BabyScripts. Patient states it was not her BP, it was her mothers. She put it into BabyScripts to see if BP flagged as elevated. Patient states she is doing well.

## 2022-07-24 LAB — CBC/D/PLT+RPR+RH+ABO+RUBIGG...
Basophils Absolute: 0 10*3/uL (ref 0.0–0.2)
Basos: 0 %
EOS (ABSOLUTE): 0.1 10*3/uL (ref 0.0–0.4)
Eos: 1 %
HCV Ab: NONREACTIVE
HIV Screen 4th Generation wRfx: REACTIVE
Hematocrit: 34.3 % (ref 34.0–46.6)
Hemoglobin: 11.6 g/dL (ref 11.1–15.9)
Hepatitis B Surface Ag: NEGATIVE
Immature Grans (Abs): 0 10*3/uL (ref 0.0–0.1)
Immature Granulocytes: 0 %
Lymphocytes Absolute: 0.7 10*3/uL (ref 0.7–3.1)
Lymphs: 15 %
MCH: 26.7 pg (ref 26.6–33.0)
MCHC: 33.8 g/dL (ref 31.5–35.7)
MCV: 79 fL (ref 79–97)
Monocytes Absolute: 0.3 10*3/uL (ref 0.1–0.9)
Monocytes: 6 %
Neutrophils Absolute: 3.7 10*3/uL (ref 1.4–7.0)
Neutrophils: 78 %
Platelets: 258 10*3/uL (ref 150–450)
RBC: 4.34 x10E6/uL (ref 3.77–5.28)
RDW: 13.7 % (ref 11.7–15.4)
RPR Ser Ql: NONREACTIVE
Rh Factor: POSITIVE
Rubella Antibodies, IGG: 2.03 index (ref >0.99)
WBC: 4.8 10*3/uL (ref 3.4–10.8)

## 2022-07-24 LAB — AB SCR+ANTIBODY ID: Antibody Screen: POSITIVE — AB

## 2022-07-24 LAB — HCV INTERPRETATION

## 2022-07-24 LAB — HIV 1/2 AB DIFFERENTIATION
HIV 1 Ab: REACTIVE
HIV 2 Ab: NONREACTIVE
NOTE (HIV CONF MULTIP: POSITIVE — AB

## 2022-07-24 LAB — HEMOGLOBIN A1C
Est. average glucose Bld gHb Est-mCnc: 114 mg/dL
Hgb A1c MFr Bld: 5.6 % (ref 4.8–5.6)

## 2022-07-25 ENCOUNTER — Encounter: Payer: Self-pay | Admitting: Obstetrics & Gynecology

## 2022-07-25 ENCOUNTER — Telehealth: Payer: Self-pay | Admitting: Obstetrics & Gynecology

## 2022-07-25 NOTE — Telephone Encounter (Signed)
Patient requesting her Test Results be reviewed, she said she is very worried.  [phone]

## 2022-07-26 LAB — CYTOLOGY - PAP
Adequacy: ABSENT
Chlamydia: NEGATIVE
Comment: NEGATIVE
Comment: NORMAL
Neisseria Gonorrhea: NEGATIVE

## 2022-07-26 NOTE — Telephone Encounter (Signed)
Patient also sent MyChart message re: same issue and was addressed. Nancy Fetter

## 2022-07-27 LAB — PANORAMA PRENATAL TEST FULL PANEL:PANORAMA TEST PLUS 5 ADDITIONAL MICRODELETIONS: FETAL FRACTION: 10.7

## 2022-07-31 ENCOUNTER — Telehealth: Payer: Self-pay | Admitting: General Practice

## 2022-07-31 DIAGNOSIS — R87612 Low grade squamous intraepithelial lesion on cytologic smear of cervix (LGSIL): Secondary | ICD-10-CM | POA: Insufficient documentation

## 2022-07-31 NOTE — Telephone Encounter (Signed)
Patient called into front office stating she didn't want to know the gender results because she was having a gender reveal party at a later date but then the gender showed up in her mychart account. Patient is calling to pass this along so we know for the future going forward. Apologized to patient. Confirmed she did not want the gender on the blood work results but was going to wait until the ultrasound. Patient stated there wasn't anything we could do at this time but is appreciative for the callback.

## 2022-08-01 ENCOUNTER — Encounter: Payer: Self-pay | Admitting: Infectious Diseases

## 2022-08-01 ENCOUNTER — Telehealth: Payer: Self-pay | Admitting: *Deleted

## 2022-08-01 NOTE — Telephone Encounter (Signed)
I called Pam Jones to inform her CenteringPregnancy appointment Monday 08/06/22 was cancelled and she has been placed into a traditional ob appointment with her Centering provider. I explained she will start Centering with next appt . She voices understanding. Nancy Fetter

## 2022-08-06 ENCOUNTER — Encounter: Payer: BC Managed Care – PPO | Admitting: Advanced Practice Midwife

## 2022-08-06 ENCOUNTER — Encounter: Payer: Self-pay | Admitting: Advanced Practice Midwife

## 2022-08-06 ENCOUNTER — Ambulatory Visit (INDEPENDENT_AMBULATORY_CARE_PROVIDER_SITE_OTHER): Payer: Medicaid Other | Admitting: Advanced Practice Midwife

## 2022-08-06 ENCOUNTER — Other Ambulatory Visit: Payer: Self-pay

## 2022-08-06 VITALS — BP 120/71 | HR 99 | Wt 212.9 lb

## 2022-08-06 DIAGNOSIS — R87612 Low grade squamous intraepithelial lesion on cytologic smear of cervix (LGSIL): Secondary | ICD-10-CM

## 2022-08-06 DIAGNOSIS — O0990 Supervision of high risk pregnancy, unspecified, unspecified trimester: Secondary | ICD-10-CM

## 2022-08-06 DIAGNOSIS — Z3A13 13 weeks gestation of pregnancy: Secondary | ICD-10-CM

## 2022-08-06 DIAGNOSIS — B20 Human immunodeficiency virus [HIV] disease: Secondary | ICD-10-CM

## 2022-08-06 NOTE — Progress Notes (Signed)
   PRENATAL VISIT NOTE  Subjective:  Pam Jones is a 21 y.o. G1P0 at [redacted]w[redacted]d being seen today for ongoing prenatal care.  She is currently monitored for the following issues for this high-risk pregnancy and has Supervision of high risk pregnancy, antepartum; HIV (human immunodeficiency virus infection) (HCC); Obesity in pregnancy; and LGSIL on Pap smear of cervix on their problem list.  Patient reports no complaints.  Contractions: Not present. Vag. Bleeding: None.  Movement: Absent. Denies leaking of fluid.   The following portions of the patient's history were reviewed and updated as appropriate: allergies, current medications, past family history, past medical history, past social history, past surgical history and problem list.   Objective:   Vitals:   08/06/22 1132  BP: 120/71  Pulse: 99  Weight: 212 lb 14.4 oz (96.6 kg)    Fetal Status: Fetal Heart Rate (bpm): 155   Movement: Absent     General:  Alert, oriented and cooperative. Patient is in no acute distress.  Skin: Skin is warm and dry. No rash noted.   Cardiovascular: Normal heart rate noted  Respiratory: Normal respiratory effort, no problems with respiration noted  Abdomen: Soft, gravid, appropriate for gestational age.  Pain/Pressure: Present     Pelvic: Cervical exam deferred        Extremities: Normal range of motion.  Edema: None  Mental Status: Normal mood and affect. Normal behavior. Normal judgment and thought content.   Assessment and Plan:  Pregnancy: G1P0 at [redacted]w[redacted]d 1. Supervision of high risk pregnancy, antepartum --Anticipatory guidance about next visits/weeks of pregnancy given.  --Next visit with Centering Group 8 --Pt may be interested in waterbirth.  HIV positive but undetectable viral load is likely not a contraindication but may be provider discretion.  Pt may take class for more information before deciding. Information given to register for class.  Will continue to discuss.   2. [redacted] weeks  gestation of pregnancy   3. LGSIL on Pap smear of cervix -Repeat Pap in 2024  4. HIV infection, unspecified symptom status (HCC) -viral load undetectable, f/u with ID  Preterm labor symptoms and general obstetric precautions including but not limited to vaginal bleeding, contractions, leaking of fluid and fetal movement were reviewed in detail with the patient. Please refer to After Visit Summary for other counseling recommendations.   No follow-ups on file.  Future Appointments  Date Time Provider Department Center  09/03/2022  9:00 AM CENTERING PROVIDER [facility]  09/12/2022  8:45 AM [facility] NURSE [facility]  09/12/2022  9:00 AM [facility] US1 [facility]  10/01/2022  9:00 AM CENTERING PROVIDER [facility]  10/29/2022  9:00 AM CENTERING PROVIDER [facility]  11/12/2022  9:00 AM CENTERING PROVIDER [facility]  11/27/2022  9:00 AM CENTERING PROVIDER [facility]  12/10/2022  9:00 AM CENTERING PROVIDER [facility]  12/24/2022  9:00 AM CENTERING PROVIDER [facility]  01/07/2023  9:00 AM CENTERING PROVIDER [facility]  01/21/2023  9:00 AM CENTERING PROVIDER [facility]    Sharen Counter, CNM

## 2022-08-07 LAB — HORIZON CUSTOM: REPORT SUMMARY: POSITIVE — AB

## 2022-08-27 ENCOUNTER — Telehealth: Payer: Self-pay | Admitting: *Deleted

## 2022-08-27 ENCOUNTER — Encounter: Payer: Self-pay | Admitting: *Deleted

## 2022-08-27 NOTE — Telephone Encounter (Signed)
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 I called to notify pt but reached voice mail. I left message I am calling re: results and will send a detailed MyChart message for her to read and reply to if needed. Staci Acosta

## 2022-09-03 ENCOUNTER — Ambulatory Visit (INDEPENDENT_AMBULATORY_CARE_PROVIDER_SITE_OTHER): Payer: BC Managed Care – PPO | Admitting: Advanced Practice Midwife

## 2022-09-03 ENCOUNTER — Encounter: Payer: Self-pay | Admitting: Advanced Practice Midwife

## 2022-09-03 VITALS — BP 126/77 | HR 81 | Wt 231.8 lb

## 2022-09-03 DIAGNOSIS — Z3A17 17 weeks gestation of pregnancy: Secondary | ICD-10-CM

## 2022-09-03 DIAGNOSIS — O0990 Supervision of high risk pregnancy, unspecified, unspecified trimester: Secondary | ICD-10-CM

## 2022-09-03 DIAGNOSIS — O98719 Human immunodeficiency virus [HIV] disease complicating pregnancy, unspecified trimester: Secondary | ICD-10-CM

## 2022-09-03 NOTE — Progress Notes (Signed)
       PRENATAL VISIT NOTE- Centering Pregnancy Cycle 8, Session # 1  Subjective:  Pam Jones is a 21 y.o. G1P0 at [redacted]w[redacted]d being seen today for ongoing prenatal care through Centering Pregnancy.  She is currently monitored for the following issues for this high-risk pregnancy and has Supervision of high risk pregnancy, antepartum; HIV (human immunodeficiency virus infection) ([place]); Obesity in pregnancy; and LGSIL on Pap smear of cervix on their problem list.  Patient reports no complaints.  Contractions: Not present. Vag. Bleeding: None.  Movement: Present. Denies leaking of fluid/ROM.   The following portions of the patient's history were reviewed and updated as appropriate: allergies, current medications, past family history, past medical history, past social history, past surgical history and problem list. Problem list updated.  Objective:   Vitals:   09/03/22 1204  BP: 126/77  Pulse: 81  Weight: 231 lb 12.8 oz (105.1 kg)    Fetal Status: Fetal Heart Rate (bpm): 152   Movement: Present     General:  Alert, oriented and cooperative. Patient is in no acute distress.  Skin: Skin is warm and dry. No rash noted.   Cardiovascular: Normal heart rate noted  Respiratory: Normal respiratory effort, no problems with respiration noted  Abdomen: Soft, gravid, appropriate for gestational age.  Pain/Pressure: Absent     Pelvic: Cervical exam deferred        Extremities: Normal range of motion.  Edema: None  Mental Status: Normal mood and affect. Normal behavior. Normal judgment and thought content.   Assessment and Plan:  Pregnancy: G1P0 at [redacted]w[redacted]d  1. Supervision of high risk pregnancy, antepartum   Centering Pregnancy, Session#1: Introduction to model of care. Group determined rules for self-governance and closing phrase. Oriented group to space and mother's notebook.   Facilitated discussion today:  common discomforts, When to call practice  Mindfulness activity completed as  well as introduction to deep breathing for childbirth preparation- Centering 3 Breaths  Fundal height and FHR appropriate today unless noted otherwise in plan of care. Patient to continue group care.    2. HIV affecting pregnancy, antepartum --Viral load undetectable, f/u with ID  3. [redacted] weeks gestation of pregnancy      Preterm labor symptoms and general obstetric precautions including but not limited to vaginal bleeding, contractions, leaking of fluid and fetal movement were reviewed in detail with the patient. Please refer to After Visit Summary for other counseling recommendations.  Return in about 4 weeks (around 10/01/2022) for Centering as scheduled.  Future Appointments  Date Time Provider Will  09/12/2022  8:45 AM [facility] NURSE [facility]  09/12/2022  9:00 AM [facility] US1 [facility]  10/01/2022  9:00 AM CENTERING PROVIDER [facility]  10/11/2022  3:30 PM Tonsina Callas, NP [facility]  10/29/2022  9:00 AM CENTERING PROVIDER [facility]  11/12/2022  9:00 AM CENTERING PROVIDER [facility]  11/27/2022  9:00 AM CENTERING PROVIDER [facility]  12/10/2022  9:00 AM CENTERING PROVIDER [facility]  12/24/2022  9:00 AM CENTERING PROVIDER [facility]  01/07/2023  9:00 AM CENTERING PROVIDER [facility]  01/21/2023  9:00 AM CENTERING PROVIDER [facility]    Fatima Blank, CNM

## 2022-09-06 ENCOUNTER — Telehealth: Payer: Self-pay | Admitting: Lactation Services

## 2022-09-06 NOTE — Telephone Encounter (Addendum)
Called and spoke with mom who would like to know about Descovy and Tacavy while breast feeding. Reviewed with mom that I do not have the most recent information. Gave patient the number to the [facility] [place] to call and ask about the most recent research with these medications. Patient voiced understanding.     ----- Message from Dunbar, RN sent at 09/03/2022 12:03 PM EDT ----- Regarding: Medication concerns w/breastfeeding Ivin Booty,  This lovely patient (I really mean that - she is a delight)  has some concerns about the the antiviral medications she is taking and wanting to know whether they are safe for breastfeeding. Please call her when you have time.   Thanks, Diane

## 2022-09-12 ENCOUNTER — Other Ambulatory Visit: Payer: Self-pay

## 2022-09-12 ENCOUNTER — Ambulatory Visit: Payer: Medicaid Other

## 2022-09-12 ENCOUNTER — Ambulatory Visit: Payer: Medicaid Other | Attending: Obstetrics & Gynecology

## 2022-09-12 VITALS — BP 122/61 | HR 83

## 2022-09-12 DIAGNOSIS — B20 Human immunodeficiency virus [HIV] disease: Secondary | ICD-10-CM

## 2022-09-12 DIAGNOSIS — O0990 Supervision of high risk pregnancy, unspecified, unspecified trimester: Secondary | ICD-10-CM | POA: Insufficient documentation

## 2022-09-12 DIAGNOSIS — Z362 Encounter for other antenatal screening follow-up: Secondary | ICD-10-CM

## 2022-09-12 DIAGNOSIS — O98719 Human immunodeficiency virus [HIV] disease complicating pregnancy, unspecified trimester: Secondary | ICD-10-CM | POA: Insufficient documentation

## 2022-09-12 DIAGNOSIS — O99210 Obesity complicating pregnancy, unspecified trimester: Secondary | ICD-10-CM | POA: Diagnosis not present

## 2022-09-12 DIAGNOSIS — O99212 Obesity complicating pregnancy, second trimester: Secondary | ICD-10-CM

## 2022-10-01 ENCOUNTER — Ambulatory Visit (INDEPENDENT_AMBULATORY_CARE_PROVIDER_SITE_OTHER): Payer: Medicaid Other | Admitting: Advanced Practice Midwife

## 2022-10-01 VITALS — BP 117/80 | HR 83 | Wt 244.0 lb

## 2022-10-01 DIAGNOSIS — Z3A21 21 weeks gestation of pregnancy: Secondary | ICD-10-CM

## 2022-10-01 DIAGNOSIS — O0990 Supervision of high risk pregnancy, unspecified, unspecified trimester: Secondary | ICD-10-CM | POA: Diagnosis not present

## 2022-10-01 DIAGNOSIS — O99210 Obesity complicating pregnancy, unspecified trimester: Secondary | ICD-10-CM

## 2022-10-01 DIAGNOSIS — B20 Human immunodeficiency virus [HIV] disease: Secondary | ICD-10-CM

## 2022-10-01 NOTE — Progress Notes (Signed)
       PRENATAL VISIT NOTE- Centering Pregnancy Cycle 8, Session # 2 and 3  Subjective:  Pam Jones is a 21 y.o. G1P0 at [redacted]w[redacted]d being seen today for ongoing prenatal care through Centering Pregnancy.  She is currently monitored for the following issues for this high-risk pregnancy and has Supervision of high risk pregnancy, antepartum; HIV (human immunodeficiency virus infection) ([place]); Obesity in pregnancy; and LGSIL on Pap smear of cervix on their problem list.  Patient reports  pelvic pressure and sharp shooting vaginal pain, no regular cramping or contractions .  Contractions: Not present.  .  Movement: Present. Denies leaking of fluid/ROM.   The following portions of the patient's history were reviewed and updated as appropriate: allergies, current medications, past family history, past medical history, past social history, past surgical history and problem list. Problem list updated.  Objective:   Vitals:   10/01/22 0918  BP: 117/80  Pulse: 83  Weight: 244 lb (110.7 kg)    Fetal Status: Fetal Heart Rate (bpm): 147 Fundal Height: 22 cm Movement: Present     General:  Alert, oriented and cooperative. Patient is in no acute distress.  Skin: Skin is warm and dry. No rash noted.   Cardiovascular: Normal heart rate noted  Respiratory: Normal respiratory effort, no problems with respiration noted  Abdomen: Soft, gravid, appropriate for gestational age.  Pain/Pressure: Present     Pelvic: Cervical exam deferred        Extremities: Normal range of motion.     Mental Status: Normal mood and affect. Normal behavior. Normal judgment and thought content.   Assessment and Plan:  Pregnancy: G1P0 at [redacted]w[redacted]d  1. Supervision of high risk pregnancy, antepartum  Centering Pregnancy, Session#2/3: Reviewed rules for self-governance with group. Direct group to [employer].   Facilitated discussion today:  Nutrition, Oral Health, Breastfeeding  Mindfulness activity completed with  3 deep breaths.    Fundal height and FHR appropriate today unless noted otherwise in plan. Patient to continue group care.     - AFP, Serum, Open Spina Bifida  2. HIV infection, unspecified symptom status ([place]) --Followed by ID  3. [redacted] weeks gestation of pregnancy    Preterm labor symptoms and general obstetric precautions including but not limited to vaginal bleeding, contractions, leaking of fluid and fetal movement were reviewed in detail with the patient. Please refer to After Visit Summary for other counseling recommendations.  Return for Centering Sessions as scheduled.  Future Appointments  Date Time Provider [place]  10/10/2022  8:15 AM [facility] NURSE [facility]  10/10/2022  8:30 AM [facility] US3 [facility]  10/11/2022  3:30 PM Jena Callas, NP [facility]  10/29/2022  9:00 AM CENTERING PROVIDER [facility]  11/12/2022  9:00 AM CENTERING PROVIDER [facility]  11/27/2022  9:00 AM CENTERING PROVIDER [facility]  12/10/2022  9:00 AM CENTERING PROVIDER [facility]  12/24/2022  9:00 AM CENTERING PROVIDER [facility]  01/07/2023  9:00 AM CENTERING PROVIDER [facility]  01/21/2023  9:00 AM CENTERING PROVIDER [facility]    Fatima Blank, CNM

## 2022-10-01 NOTE — Addendum Note (Signed)
Addended by: Michel Harrow on: 10/01/2022 04:20 PM   Modules accepted: Orders

## 2022-10-02 ENCOUNTER — Encounter: Payer: Self-pay | Admitting: *Deleted

## 2022-10-03 LAB — AFP, SERUM, OPEN SPINA BIFIDA
AFP MoM: 0.62
AFP Value: 36.5 ng/mL
Gest. Age on Collection Date: 21.7 weeks
Maternal Age At EDD: 22.7 yr
OSBR Risk 1 IN: 10000
Test Results:: NEGATIVE
Weight: 244 [lb_av]

## 2022-10-03 LAB — CULTURE, OB URINE

## 2022-10-03 LAB — URINE CULTURE, OB REFLEX

## 2022-10-10 ENCOUNTER — Ambulatory Visit: Payer: Medicaid Other | Attending: Maternal & Fetal Medicine

## 2022-10-10 ENCOUNTER — Ambulatory Visit: Payer: Medicaid Other | Admitting: *Deleted

## 2022-10-10 ENCOUNTER — Other Ambulatory Visit: Payer: Self-pay | Admitting: *Deleted

## 2022-10-10 VITALS — BP 127/52 | HR 79

## 2022-10-10 DIAGNOSIS — E669 Obesity, unspecified: Secondary | ICD-10-CM

## 2022-10-10 DIAGNOSIS — Z3A23 23 weeks gestation of pregnancy: Secondary | ICD-10-CM | POA: Diagnosis not present

## 2022-10-10 DIAGNOSIS — O99212 Obesity complicating pregnancy, second trimester: Secondary | ICD-10-CM | POA: Diagnosis not present

## 2022-10-10 DIAGNOSIS — O98719 Human immunodeficiency virus [HIV] disease complicating pregnancy, unspecified trimester: Secondary | ICD-10-CM

## 2022-10-10 DIAGNOSIS — O98712 Human immunodeficiency virus [HIV] disease complicating pregnancy, second trimester: Secondary | ICD-10-CM | POA: Diagnosis not present

## 2022-10-10 DIAGNOSIS — O0990 Supervision of high risk pregnancy, unspecified, unspecified trimester: Secondary | ICD-10-CM

## 2022-10-10 DIAGNOSIS — B20 Human immunodeficiency virus [HIV] disease: Secondary | ICD-10-CM | POA: Diagnosis not present

## 2022-10-10 DIAGNOSIS — Z148 Genetic carrier of other disease: Secondary | ICD-10-CM

## 2022-10-10 DIAGNOSIS — O99210 Obesity complicating pregnancy, unspecified trimester: Secondary | ICD-10-CM | POA: Insufficient documentation

## 2022-10-10 DIAGNOSIS — Z362 Encounter for other antenatal screening follow-up: Secondary | ICD-10-CM

## 2022-10-11 ENCOUNTER — Ambulatory Visit (INDEPENDENT_AMBULATORY_CARE_PROVIDER_SITE_OTHER): Payer: Medicaid Other | Admitting: Infectious Diseases

## 2022-10-11 ENCOUNTER — Other Ambulatory Visit: Payer: Self-pay

## 2022-10-11 ENCOUNTER — Encounter: Payer: Self-pay | Admitting: Infectious Diseases

## 2022-10-11 VITALS — BP 113/76 | HR 92 | Temp 97.4°F | Ht 70.0 in | Wt 248.0 lb

## 2022-10-11 DIAGNOSIS — K130 Diseases of lips: Secondary | ICD-10-CM

## 2022-10-11 DIAGNOSIS — Z21 Asymptomatic human immunodeficiency virus [HIV] infection status: Secondary | ICD-10-CM | POA: Diagnosis not present

## 2022-10-11 NOTE — Patient Instructions (Addendum)
Will plan to do a follow up around 34 weeks - early February.   [employer] website   [employer]  ?? [phone] ?? [email or web address] .[facility] Address: [address], [place], [place] [address] Phone: [phone]

## 2022-10-11 NOTE — Progress Notes (Signed)
Name: Pam Jones  DOB: 2000/12/13 MRN: [id number] PCP: Pcp, No     Brief Narrative:  Pam Jones is a 21 y.o. female with HIV diagnosed in May of 2023.  CD4 nadir 350 HIV Risk: sexual History of OIs: none Intake Labs: Hep B sAg (-), sAb (), cAb (); Hep A (-), Hep C (-) Quantiferon (-) HLA B*5701 () G6PD: ()   Previous Regimens: Tivicay + Descovy (during pregnancy)   Genotypes: 2023 - VL too small to detect   Subjective:   Chief Complaint  Patient presents with   Follow-up     HPI: [name] is here today for routine follow up care for HIV; currently pregnant and progressing nicely through the second trimester without obstetrical concerns.   Continues tivicay and descovy together once daily without any concerns. Able to keep medication down.   Only worry today is bumps along the boarder of her lip that have come up recently.       10/11/2022    3:31 PM  Depression screen PHQ 2/9  Decreased Interest 0  Down, Depressed, Hopeless 0  PHQ - 2 Score 0    Review of Systems  Constitutional:  Negative for chills, fever, malaise/fatigue and weight loss.  HENT:  Negative for sore throat.        No dental problems  Respiratory:  Negative for cough and sputum production.   Cardiovascular:  Negative for chest pain and leg swelling.  Gastrointestinal:  Negative for abdominal pain, diarrhea and vomiting.  Genitourinary:  Negative for dysuria and flank pain.  Musculoskeletal:  Negative for joint pain, myalgias and neck pain.  Skin:  Negative for rash.  Neurological:  Negative for dizziness, tingling and headaches.  Psychiatric/Behavioral:  Negative for depression and substance abuse. The patient is not nervous/anxious and does not have insomnia.     Past Medical History:  Diagnosis Date   COVID-19    04/2021   HIV (human immunodeficiency virus infection) ([place]) 03/2022   Hypertension     Outpatient Medications Prior to Visit  Medication Sig Dispense  Refill   Blood Pressure Monitoring (BLOOD PRESSURE KIT) Pam Jones 1 Device by Does not apply route once a week. 1 each 0   DESCOVY 200-25 MG tablet Take 1 tablet by mouth daily. 30 tablet 11   Prenatal Vit-Fe Fumarate-FA (PRENATAL VITAMINS PO) Take 1 tablet by mouth daily.     promethazine (PHENERGAN) 25 MG tablet Take 1 tablet (25 mg total) by mouth every 6 (six) hours as needed for nausea or vomiting. 30 tablet 2   TIVICAY 50 MG tablet Take 1 tablet (50 mg total) by mouth daily. 30 tablet 11   Doxylamine-Pyridoxine (DICLEGIS) 10-10 MG TBEC Take 2 tablets by mouth at bedtime as needed. (Patient not taking: Reported on 09/03/2022) 60 tablet 5   No facility-administered medications prior to visit.     Allergies  Allergen Reactions   Amoxicillin Hives   Banana Itching and Swelling   Doxycycline Hives   Kiwi Extract Swelling   Paxlovid [Nirmatrelvir-Ritonavir] Hives   Strawberry (Diagnostic) Swelling    Social History   Tobacco Use   Smoking status: Never    Passive exposure: Yes   Smokeless tobacco: Never  Vaping Use   Vaping Use: Never used  Substance Use Topics   Alcohol use: Not Currently    Comment: occasionally before pregnancy   Drug use: Not Currently    Types: Marijuana    Comment: last used before found out pregnancy  Family History  Problem Relation Age of Onset   Heart disease Mother    Hypertension Mother    Healthy Mother    Rheumatic fever Mother    Stroke Father    Hypertension Father    Hypertension Paternal Grandmother    Diabetes Paternal Grandmother    Asthma Neg Hx     Social History   Substance and Sexual Activity  Sexual Activity Yes   Birth control/protection: None     Objective:   Vitals:   10/11/22 1530  BP: 113/76  Pulse: 92  Temp: (!) 97.4 F (36.3 C)  TempSrc: Temporal  SpO2: 100%  Weight: 248 lb (112.5 kg)  Height: _0  (1.778 m)   Body mass index is 35.58 kg/m.   Physical Exam Vitals reviewed.  Constitutional:       Appearance: She is well-developed.     Comments: Seated comfortably in chair.   HENT:     Mouth/Throat:     Mouth: No oral lesions.     Dentition: Normal dentition. No dental abscesses.     Pharynx: No oropharyngeal exudate.  Cardiovascular:     Rate and Rhythm: Normal rate and regular rhythm.     Heart sounds: Normal heart sounds.  Pulmonary:     Effort: Pulmonary effort is normal.     Breath sounds: Normal breath sounds.  Abdominal:     General: There is no distension.     Palpations: Abdomen is soft.     Tenderness: There is no abdominal tenderness.  Lymphadenopathy:     Cervical: No cervical adenopathy.  Skin:    General: Skin is warm and dry.     Findings: No rash.     Comments: Skin colored fluid filled tiny bumps noted along upper lip boarder. Non inflamed appearing.   Neurological:     Mental Status: She is alert and oriented to person, place, and time.  Psychiatric:        Judgment: Judgment normal.     Lab Results Lab Results  Component Value Date   WBC 4.4 11/01/2022   HGB 12.3 11/01/2022   HCT 38.3 11/01/2022   MCV 86.5 11/01/2022   PLT 208 11/01/2022    Lab Results  Component Value Date   CREATININE 0.65 10/11/2022   BUN 9 10/11/2022   NA 138 10/11/2022   K 4.0 10/11/2022   CL 105 10/11/2022   CO2 25 10/11/2022    Lab Results  Component Value Date   ALT 19 10/11/2022   AST 19 10/11/2022   ALKPHOS 47 07/03/2022   BILITOT 0.3 10/11/2022    Lab Results  Component Value Date   CHOL 143 06/25/2012   HDL 51 06/25/2012   LDLCALC 79 06/25/2012   TRIG 67 06/25/2012   CHOLHDL 2.8 06/25/2012   HIV 1 RNA Quant (Copies/mL)  Date Value  10/11/2022 39 (H)  07/06/2022 21 (H)   CD4 T Cell Abs (/uL)  Date Value  11/01/2022 367 (L)     Assessment & Plan:   Problem List Items Addressed This Visit       Unprioritized   HIV (human immunodeficiency virus infection) ([place]) - Primary    Very well controlled on once daily Tivicay and Descovy taken  together once daily. She has continued to do well with adherence and tolerance of the medication w/o much nausea now. Separates PNV > 6h from dose.  No drug interactions identified. Pertinent lab tests ordered today.   No changes to insurance coverage.  No dental needs today.  No concern over anxious/depressed mood.  Sexual health and family planning discussed - currently pregnant 2nd trimester. Will see her back around 36 weeks for another viral load to help with planning for mode of delivery. Encouraged that if she continues her medication everyday risk for vertical transmission < 1%. TDap planned with OB - I asked her to please discuss RSV vaccine and see about getting that with their team as we don't have supply here. Recommend flu vaccine.   Return in about 3 months (around 01/11/2023).        Relevant Medications   valACYclovir (VALTREX) 500 MG tablet   Other Relevant Orders   HIV 1 RNA quant-no reflex-bld (Completed)   Vitamin D (25 hydroxy) (Completed)   COMPLETE METABOLIC PANEL WITH GFR (Completed)   Rash on lips    Seems most c/w herpetic rash. She said she had testing her herpes before but negative (I believe a blood test). Best test would be a viral PCR of the fluid in the blister to confirm if herpetic in nature from HSV.  We talked about valtrex suppressive therapy to see if this helps.       Janene Madeira, MSN, NP-C [facility] [place] Pager: [phone] Office: [phone]  11/06/22  2:37 PM

## 2022-10-14 DIAGNOSIS — O98719 Human immunodeficiency virus [HIV] disease complicating pregnancy, unspecified trimester: Secondary | ICD-10-CM

## 2022-10-15 LAB — COMPLETE METABOLIC PANEL WITH GFR
AG Ratio: 1.4 (calc) (ref 1.0–2.5)
ALT: 19 U/L (ref 6–29)
AST: 19 U/L (ref 10–30)
Albumin: 3.7 g/dL (ref 3.6–5.1)
Alkaline phosphatase (APISO): 63 U/L (ref 31–125)
BUN: 9 mg/dL (ref 7–25)
CO2: 25 mmol/L (ref 20–32)
Calcium: 9.3 mg/dL (ref 8.6–10.2)
Chloride: 105 mmol/L (ref 98–110)
Creat: 0.65 mg/dL (ref 0.50–0.96)
Globulin: 2.7 g/dL (calc) (ref 1.9–3.7)
Glucose, Bld: 78 mg/dL (ref 65–99)
Potassium: 4 mmol/L (ref 3.5–5.3)
Sodium: 138 mmol/L (ref 135–146)
Total Bilirubin: 0.3 mg/dL (ref 0.2–1.2)
Total Protein: 6.4 g/dL (ref 6.1–8.1)
eGFR: 128 mL/min/{1.73_m2} (ref >=60)

## 2022-10-15 LAB — HIV-1 RNA QUANT-NO REFLEX-BLD
HIV 1 RNA Quant: 39 Copies/mL — ABNORMAL HIGH
HIV-1 RNA Quant, Log: 1.59 Log cps/mL — ABNORMAL HIGH

## 2022-10-15 LAB — VITAMIN D 25 HYDROXY (VIT D DEFICIENCY, FRACTURES): Vit D, 25-Hydroxy: 27 ng/mL — ABNORMAL LOW (ref 30–100)

## 2022-10-24 ENCOUNTER — Encounter: Payer: Self-pay | Admitting: Advanced Practice Midwife

## 2022-10-28 ENCOUNTER — Encounter: Payer: Self-pay | Admitting: Advanced Practice Midwife

## 2022-10-28 DIAGNOSIS — J111 Influenza due to unidentified influenza virus with other respiratory manifestations: Secondary | ICD-10-CM

## 2022-10-29 ENCOUNTER — Telehealth (HOSPITAL_BASED_OUTPATIENT_CLINIC_OR_DEPARTMENT_OTHER): Payer: Self-pay | Admitting: Advanced Practice Midwife

## 2022-10-29 ENCOUNTER — Encounter: Payer: Self-pay | Admitting: Family Medicine

## 2022-10-29 MED ORDER — OSELTAMIVIR PHOSPHATE 75 MG PO CAPS: 75.0000 mg | ORAL_CAPSULE | Freq: Two times a day (BID) | ORAL | 0 refills | Status: AC

## 2022-10-29 NOTE — Telephone Encounter (Signed)
I called Pam Jones and verified identity using 2 markers. Pt called and sent MyChart message this morning because she could not make it to Centering Pregnancy session since she has the flu.  I sent a MyChart message this morning and a prescription for Tamiflu to the patient's pharmacy. I called this afternoon to check on the patient and make sure she received my earlier message and prescription. I reviewed reasons to seek care and safe OTC medications to manage symptoms.  Questions answered.  Pt to return to Centering with the next session on 12/18 and does not need to schedule a traditional office visit due to the missed Centering session.

## 2022-10-30 ENCOUNTER — Encounter: Payer: Self-pay | Admitting: Family Medicine

## 2022-11-01 ENCOUNTER — Inpatient Hospital Stay (HOSPITAL_COMMUNITY)
Admission: EM | Admit: 2022-11-01 | Discharge: 2022-11-01 | Disposition: A | Discharge status: Home / Self Care | Payer: Medicaid Other | Attending: Obstetrics and Gynecology | Admitting: Obstetrics and Gynecology

## 2022-11-01 ENCOUNTER — Other Ambulatory Visit: Payer: Self-pay

## 2022-11-01 DIAGNOSIS — O98712 Human immunodeficiency virus [HIV] disease complicating pregnancy, second trimester: Secondary | ICD-10-CM | POA: Insufficient documentation

## 2022-11-01 DIAGNOSIS — Z8616 Personal history of COVID-19: Secondary | ICD-10-CM | POA: Insufficient documentation

## 2022-11-01 DIAGNOSIS — J111 Influenza due to unidentified influenza virus with other respiratory manifestations: Secondary | ICD-10-CM | POA: Diagnosis not present

## 2022-11-01 DIAGNOSIS — Z3A26 26 weeks gestation of pregnancy: Secondary | ICD-10-CM

## 2022-11-01 DIAGNOSIS — R6883 Chills (without fever): Secondary | ICD-10-CM | POA: Diagnosis not present

## 2022-11-01 DIAGNOSIS — O26892 Other specified pregnancy related conditions, second trimester: Secondary | ICD-10-CM | POA: Insufficient documentation

## 2022-11-01 DIAGNOSIS — B20 Human immunodeficiency virus [HIV] disease: Secondary | ICD-10-CM | POA: Diagnosis not present

## 2022-11-01 DIAGNOSIS — R6889 Other general symptoms and signs: Secondary | ICD-10-CM | POA: Diagnosis not present

## 2022-11-01 LAB — CBC
HCT: 38.3 % (ref 36.0–46.0)
Hemoglobin: 12.3 g/dL (ref 12.0–15.0)
MCH: 27.8 pg (ref 26.0–34.0)
MCHC: 32.1 g/dL (ref 30.0–36.0)
MCV: 86.5 fL (ref 80.0–100.0)
Platelets: 208 10*3/uL (ref 150–400)
RBC: 4.43 MIL/uL (ref 3.87–5.11)
RDW: 13.9 % (ref 11.5–15.5)
WBC: 4.4 10*3/uL (ref 4.0–10.5)
nRBC: 0 % (ref 0.0–0.2)

## 2022-11-01 LAB — T-HELPER CELLS (CD4) COUNT (NOT AT ARMC)
CD4 % Helper T Cell: 46 % (ref 33–65)
CD4 T Cell Abs: 367 /uL — ABNORMAL LOW (ref 400–1790)

## 2022-11-01 NOTE — ED Provider Triage Note (Signed)
Emergency Medicine Provider OB Triage Evaluation Note  Pam Jones is a 21 y.o. female, G1P0, at [redacted]w[redacted]d gestation who presents to the emergency department with complaints of flu like symptoms.  States that her temp was 95 this morning.  She states that she talked with her OBGYN and was told to come get her WBC checked.  She states her boyfriend was diagnosed with flu on Sunday.  She denies abdominal pains or pregnancy related problems.  Review of  Systems  Positive: chills, body aches Negative: abdominal pain  Physical Exam  BP 131/77 (BP Location: Right Arm)   Pulse (!) 103   Temp 98.3 F (36.8 C) (Oral)   Resp 20   LMP 05/02/2022   SpO2 100%  General: Awake, no distress  HEENT: Atraumatic  Resp: Normal effort  Cardiac: Normal rate Abd: Nondistended, nontender  MSK: Moves all extremities without difficulty Neuro: Speech clear  Medical Decision Making  Pt evaluated for pregnancy concern and is stable for transfer to MAU. Pt is in agreement with plan for transfer.  5:55 AM Discussed with MAU APP, Hilda Lias, who accepts patient in transfer.  Clinical Impression   1. Flu-like symptoms        Roxy Horseman, PA-C 11/01/22 4132

## 2022-11-01 NOTE — MAU Note (Signed)
.  Pam Jones is a 21 y.o. at [redacted]w[redacted]d here in MAU reporting: Flu, fever of 103 Monday and Tuesday. OB prescribed Tamiflu, taking since Tuesday that was seeming effective. Pt reports sinus pressure, sore throat, congestion, night sweats and chills started last night, pt states she woke up had to change her clothes. Pt states she called MAU and was told to go to the ER, and she wanted to come in to make sure her WBCs was not high. Pt report dark orange urine. Pt denies VB, DFM, LOF.  Onset of complaint: Monday  Pain score: 4/10 Vitals:   11/01/22 0548 11/01/22 0551  BP: 131/77   Pulse: (!) 103   Resp: 20   Temp:  98.3 F (36.8 C)  SpO2: 100%      FHT:131 Lab orders placed from triage:

## 2022-11-01 NOTE — ED Triage Notes (Signed)
Patient is [redacted] weeks pregnant G1P0, reports chills with sweats onset yesterday , afebrile . Report given to MAU RN on patient's transfer .

## 2022-11-01 NOTE — MAU Provider Note (Signed)
Chief Complaint:  Chills   Event Date/Time   First Provider Initiated Contact with Patient 11/01/22 [phone]     HPI  HPI: Pam Jones is a 21 y.o. G1P0 at 18w1dho presents to maternity admissions reporting having the flu this week.  Already started taking Tamiflu Monday.  No fever   States temp has been low.  Some chills  No dyspnea. Boyfriend had flu confirmed last week.  States called today and a man who called himself "MAU Nurse" told her to come to ED and get her WBC checked.  She has HIV but not clear why she was told to do that. Followed by ID and last CD4 count was 341..Marland KitchenShe reports good fetal movement, denies LOF, vaginal bleeding, vaginal itching/burning, urinary symptoms, h/a, dizziness, n/v, diarrhea, constipation or fever/chills.  She denies headache, visual changes or RUQ abdominal pain.  ED Note: MLYNDSY GILBERTOis a 21y.o. female, G1P0, at 279w1destation who presents to the emergency department with complaints of flu like symptoms.  States that her temp was 95 this morning.  She states that she talked with her OBGYN and was told to come get her WBC checked.  She states her boyfriend was diagnosed with flu on Sunday.  She denies abdominal pains or pregnancy related problems.   Past Medical History: Past Medical History:  Diagnosis Date   COVID-19    04/2021   [place]   Hypertension     Past obstetric history: OB History  Gravida Para Term Preterm AB Living  1            SAB IAB Ectopic Multiple Live Births               # Outcome Date GA Lbr Len/2nd Weight Sex Delivery Anes PTL Lv  1 Current             Past Surgical History: Past Surgical History:  Procedure Laterality Date   FEMUR FRACTURE SURGERY      Family History: Family History  Problem Relation Age of Onset   Heart disease Mother    Hypertension Mother    Healthy Mother    Rheumatic fever Mother    Stroke Father    Hypertension Father     Hypertension Paternal Grandmother    Diabetes Paternal Grandmother    Asthma Neg Hx     Social History: Social History   Tobacco Use   Smoking status: Never    Passive exposure: Yes   Smokeless tobacco: Never  Vaping Use   Vaping Use: Never used  Substance Use Topics   Alcohol use: Not Currently    Comment: occasionally before pregnancy   Drug use: Not Currently    Types: Marijuana    Comment: last used before found out pregnancy    Allergies:  Allergies  Allergen Reactions   Amoxicillin Hives   Banana Itching and Swelling   Doxycycline Hives   Kiwi Extract Swelling   Paxlovid [Nirmatrelvir-Ritonavir] Hives   Strawberry (Diagnostic) Swelling    Meds:  Medications Prior to Admission  Medication Sig Dispense Refill Last Dose   Blood Pressure Monitoring (BLOOD PRESSURE KIT) DEVI 1 Device by Does not apply route once a week. 1 each 0    DESCOVY 200-25 MG tablet Take 1 tablet by mouth daily. 30 tablet 11    Doxylamine-Pyridoxine (DICLEGIS) 10-10 MG TBEC Take 2 tablets by mouth at bedtime as needed. (Patient not taking: Reported on 09/03/2022) 60  tablet 5    oseltamivir (TAMIFLU) 75 MG capsule Take 1 capsule (75 mg total) by mouth 2 (two) times daily for 5 days. 10 capsule 0    Prenatal Vit-Fe Fumarate-FA (PRENATAL VITAMINS PO) Take 1 tablet by mouth daily.      promethazine (PHENERGAN) 25 MG tablet Take 1 tablet (25 mg total) by mouth every 6 (six) hours as needed for nausea or vomiting. 30 tablet 2    TIVICAY 50 MG tablet Take 1 tablet (50 mg total) by mouth daily. 30 tablet 11     I have reviewed patient's Past Medical Hx, Surgical Hx, Family Hx, Social Hx, medications and allergies.   ROS:  Review of Systems  Constitutional:  Positive for chills. Negative for fever.  HENT:  Negative for congestion.   Eyes:  Negative for visual disturbance.  Respiratory:  Negative for cough, chest tightness, shortness of breath and wheezing.   Gastrointestinal:  Negative for abdominal  pain and nausea.  Genitourinary:  Negative for vaginal bleeding.  Allergic/Immunologic: Positive for immunocompromised state.  Neurological:  Negative for dizziness.   Other systems negative  Physical Exam  Patient Vitals for the past 24 hrs:  BP Temp Temp src Pulse Resp SpO2 Height Weight  11/01/22 0611 109/66 98.1 F (36.7 C) Oral (!) 103 20 -- _0  (1.778 m) 113.8 kg  11/01/22 0551 -- 98.3 F (36.8 C) Oral -- -- -- -- --  11/01/22 0548 131/77 -- -- (!) 103 20 100 % -- --   Constitutional: Well-developed, well-nourished female in no acute distress.  Cardiovascular: normal rate and rhythm Respiratory: normal effort, clear to auscultation bilaterally per ED provider GI: Abd soft, non-tender, gravid appropriate for gestational age.    MS: Extremities nontender, no edema, normal ROM Neurologic: Alert and oriented x 4.  GU: Neg CVAT.   FHT:  131    Labs: Results for orders placed or performed during the hospital encounter of 11/01/22 (from the past 24 hour(s))  CBC     Status: None   Collection Time: 11/01/22  6:51 AM  Result Value Ref Range   WBC 4.4 4.0 - 10.5 K/uL   RBC 4.43 3.87 - 5.11 MIL/uL   Hemoglobin 12.3 12.0 - 15.0 g/dL   HCT 38.3 36.0 - 46.0 %   MCV 86.5 80.0 - 100.0 fL   MCH 27.8 26.0 - 34.0 pg   MCHC 32.1 30.0 - 36.0 g/dL   RDW 13.9 11.5 - 15.5 %   Platelets 208 150 - 400 K/uL   nRBC 0.0 0.0 - 0.2 %    O/Positive/-- (08/25 1000)  Imaging:    MAU Course/MDM: I have reviewed the triage vital signs and the nursing notes.   Pertinent labs & imaging results that were available during my care of the patient were reviewed by me and considered in my medical decision making (see chart for details).      I have reviewed her medical records including past results, notes and treatments.   I have ordered labs and reviewed results.  Consult Dr Elgie Congo with presentation, exam findings and test results. He recommends conservative care Treatments in MAU included none.     Assessment: 1. Flu-like symptoms   2.     HIV  Plan: Discharge home Supportive care Continue Tamiflu Follow up in Office for prenatal visits  Encouraged to return if she develops worsening of symptoms, increase in pain, fever, or other concerning symptoms.   Pt stable at time of discharge.  Lelan Pons  Lavella Lemons, MSN Certified Nurse-Midwife 11/01/2022 6:29 AM

## 2022-11-06 DIAGNOSIS — K130 Diseases of lips: Secondary | ICD-10-CM | POA: Insufficient documentation

## 2022-11-06 MED ORDER — VALACYCLOVIR HCL 500 MG PO TABS: 500.0000 mg | ORAL_TABLET | Freq: Two times a day (BID) | ORAL | 2 refills | Status: DC

## 2022-11-06 NOTE — Assessment & Plan Note (Signed)
Seems most c/w herpetic rash. She said she had testing her herpes before but negative (I believe a blood test). Best test would be a viral PCR of the fluid in the blister to confirm if herpetic in nature from HSV.  We talked about valtrex suppressive therapy to see if this helps.

## 2022-11-06 NOTE — Assessment & Plan Note (Signed)
Very well controlled on once daily Tivicay and Descovy taken together once daily. She has continued to do well with adherence and tolerance of the medication w/o much nausea now. Separates PNV > 6h from dose.  No drug interactions identified. Pertinent lab tests ordered today.   No changes to insurance coverage.  No dental needs today.  No concern over anxious/depressed mood.  Sexual health and family planning discussed - currently pregnant 2nd trimester. Will see her back around 36 weeks for another viral load to help with planning for mode of delivery. Encouraged that if she continues her medication everyday risk for vertical transmission < 1%. TDap planned with OB - I asked her to please discuss RSV vaccine and see about getting that with their team as we don't have supply here. Recommend flu vaccine.   Return in about 3 months (around 01/11/2023).

## 2022-11-08 ENCOUNTER — Encounter: Payer: Self-pay | Admitting: *Deleted

## 2022-11-08 MED ORDER — DESCOVY 200-25 MG PO TABS: 1.0000 | ORAL_TABLET | Freq: Every day | ORAL | 11 refills | Status: DC

## 2022-11-08 MED ORDER — TIVICAY 50 MG PO TABS: 50.0000 mg | ORAL_TABLET | Freq: Every day | ORAL | 11 refills | Status: DC

## 2022-11-08 NOTE — Addendum Note (Signed)
Addended by: Blanchard Kelch on: 11/08/2022 03:09 PM   Modules accepted: Orders

## 2022-11-12 ENCOUNTER — Encounter: Payer: Self-pay | Admitting: Advanced Practice Midwife

## 2022-11-12 ENCOUNTER — Ambulatory Visit (INDEPENDENT_AMBULATORY_CARE_PROVIDER_SITE_OTHER): Payer: BC Managed Care – PPO | Admitting: Advanced Practice Midwife

## 2022-11-12 VITALS — BP 114/75 | HR 90 | Wt 262.0 lb

## 2022-11-12 DIAGNOSIS — J111 Influenza due to unidentified influenza virus with other respiratory manifestations: Secondary | ICD-10-CM

## 2022-11-12 DIAGNOSIS — O26893 Other specified pregnancy related conditions, third trimester: Secondary | ICD-10-CM

## 2022-11-12 DIAGNOSIS — O0990 Supervision of high risk pregnancy, unspecified, unspecified trimester: Secondary | ICD-10-CM

## 2022-11-12 DIAGNOSIS — R102 Pelvic and perineal pain: Secondary | ICD-10-CM

## 2022-11-12 DIAGNOSIS — Z3A27 27 weeks gestation of pregnancy: Secondary | ICD-10-CM

## 2022-11-12 DIAGNOSIS — O98719 Human immunodeficiency virus [HIV] disease complicating pregnancy, unspecified trimester: Secondary | ICD-10-CM

## 2022-11-12 NOTE — Patient Instructions (Signed)
Reasons to return to MAU at [facility]:  Since you are preterm, return to MAU if:  1.  Contractions are 10 minutes apart or less and they becoming more uncomfortable or painful over time 2.  You have a large gush of fluid, or a trickle of fluid that will not stop and you have to wear a pad 3.  You have bleeding that is bright red, heavier than spotting--like menstrual bleeding (spotting can be normal in early labor or after a check of your cervix) 4.  You do not feel the baby moving like he/she normally does  

## 2022-11-12 NOTE — Progress Notes (Deleted)
       PRENATAL VISIT NOTE- Centering Pregnancy Cycle 8, Session # 5  Subjective:  Pam Jones is a 21 y.o. G1P0 at [redacted]w[redacted]d being seen today for ongoing prenatal care through Centering Pregnancy.  She is currently monitored for the following issues for this high-risk pregnancy and has Supervision of high risk pregnancy, antepartum; HIV (human immunodeficiency virus infection) (HCC); Obesity in pregnancy; LGSIL on Pap smear of cervix; Influenza-like illness; and Rash on lips on their problem list.  Patient reports {sx:14538}.   .  .   . ***Denies leaking of fluid/ROM.   The following portions of the patient's history were reviewed and updated as appropriate: allergies, current medications, past family history, past medical history, past social history, past surgical history and problem list. Problem list updated.  Objective:   Vitals:   11/12/22 0932  BP: 114/75  Pulse: 90  Weight: 262 lb (118.8 kg)    Fetal Status:           General:  Alert, oriented and cooperative. Patient is in no acute distress.  Skin: Skin is warm and dry. No rash noted.   Cardiovascular: Normal heart rate noted  Respiratory: Normal respiratory effort, no problems with respiration noted  Abdomen: Soft, gravid, appropriate for gestational age.        Pelvic: {Blank single:19197::"Cervical exam performed","Cervical exam deferred"}        Extremities: Normal range of motion.     Mental Status: Normal mood and affect. Normal behavior. Normal judgment and thought content.   Assessment and Plan:  Pregnancy: G1P0 at [redacted]w[redacted]d  1. Supervision of high risk pregnancy, antepartum ***  2. Pelvic pain affecting pregnancy in third trimester, antepartum *** - Ambulatory referral to Physical Therapy  3. Influenza ***  4. [redacted] weeks gestation of pregnancy ***   *** add [facility] dot phrase for session   {Blank single:19197::"Term","Preterm"} labor symptoms and general obstetric precautions including but not  limited to vaginal bleeding, contractions, leaking of fluid and fetal movement were reviewed in detail with the patient. Please refer to After Visit Summary for other counseling recommendations.  No follow-ups on file.  Future Appointments  Date Time Provider Department Center  11/23/2022 10:30 AM [facility] LAB [facility]  11/27/2022  9:00 AM CENTERING PROVIDER [facility]  12/05/2022  2:30 PM [facility] NURSE [facility]  12/05/2022  2:45 PM [facility] US4 [facility]  12/10/2022  9:00 AM CENTERING PROVIDER [facility]  12/24/2022  9:00 AM CENTERING PROVIDER [facility]  01/02/2023  3:30 PM Blanchard Kelch, NP [facility]  01/07/2023  9:00 AM CENTERING PROVIDER [facility]  01/21/2023  9:00 AM CENTERING PROVIDER [facility]    Sharen Counter, CNM

## 2022-11-12 NOTE — Progress Notes (Signed)
       PRENATAL VISIT NOTE- Centering Pregnancy Cycle 8, Session # 5  Subjective:  Pam Jones is a 21 y.o. G1P0 at [redacted]w[redacted]d being seen today for ongoing prenatal care through Centering Pregnancy.  She is currently monitored for the following issues for this high-risk pregnancy and has Supervision of high risk pregnancy, antepartum; HIV (human immunodeficiency virus infection) (HCC); Obesity in pregnancy; LGSIL on Pap smear of cervix; Influenza-like illness; and Rash on lips on their problem list.  Patient reports  pelvic pain that radiates into vagina, occurs several times per day .  Contractions: Irritability. Vag. Bleeding: None.  Movement: Present. Denies leaking of fluid/ROM.   The following portions of the patient's history were reviewed and updated as appropriate: allergies, current medications, past family history, past medical history, past social history, past surgical history and problem list. Problem list updated.  Objective:   Vitals:   11/12/22 0932  BP: 114/75  Pulse: 90  Weight: 262 lb (118.8 kg)    Fetal Status: Fetal Heart Rate (bpm): 150 Fundal Height: 28 cm Movement: Present     General:  Alert, oriented and cooperative. Patient is in no acute distress.  Skin: Skin is warm and dry. No rash noted.   Cardiovascular: Normal heart rate noted  Respiratory: Normal respiratory effort, no problems with respiration noted  Abdomen: Soft, gravid, appropriate for gestational age.  Pain/Pressure: Present     Pelvic: Cervical exam deferred        Extremities: Normal range of motion.  Edema: None  Mental Status: Normal mood and affect. Normal behavior. Normal judgment and thought content.   Assessment and Plan:  Pregnancy: G1P0 at [redacted]w[redacted]d  1. Supervision of high risk pregnancy, antepartum  Centering Pregnancy, Session#5: Reviewed resources in [employer].   Facilitated discussion today:  Signs of labor, labor positions, coping strategies/support measures,  preterm labor, choosing pediatrician and carseat safety.  Fundal height and FHR appropriate today unless noted otherwise in plan. Patient to continue group care.     2. Pelvic pain affecting pregnancy in third trimester, antepartum --Rest/ice/heat/warm bath/increase PO fluids/Tylenol/pregnancy support belt   - Ambulatory referral to Physical Therapy  3. Influenza --Pt missed last Centering with influenza, symptoms resolved, no residual concerns.   4. [redacted] weeks gestation of pregnancy   5. HIV affecting pregnancy, antepartum --Stable on medications    Preterm labor symptoms and general obstetric precautions including but not limited to vaginal bleeding, contractions, leaking of fluid and fetal movement were reviewed in detail with the patient. Please refer to After Visit Summary for other counseling recommendations.  Return for Centering Sessions as scheduled.  Future Appointments  Date Time Provider Department Center  11/23/2022 10:30 AM [facility] LAB [facility]  11/27/2022  9:00 AM CENTERING PROVIDER [facility]  12/05/2022  2:30 PM [facility] NURSE [facility]  12/05/2022  2:45 PM [facility] US4 [facility]  12/10/2022  9:00 AM CENTERING PROVIDER [facility]  12/24/2022  9:00 AM CENTERING PROVIDER [facility]  01/02/2023  3:30 PM Blanchard Kelch, NP [facility]  01/07/2023  9:00 AM CENTERING PROVIDER [facility]  01/21/2023  9:00 AM CENTERING PROVIDER [facility]    Sharen Counter, CNM

## 2022-11-20 ENCOUNTER — Ambulatory Visit: Payer: Medicaid Other | Attending: Advanced Practice Midwife | Admitting: Physical Therapy

## 2022-11-20 DIAGNOSIS — R293 Abnormal posture: Secondary | ICD-10-CM | POA: Diagnosis not present

## 2022-11-20 DIAGNOSIS — R279 Unspecified lack of coordination: Secondary | ICD-10-CM | POA: Insufficient documentation

## 2022-11-20 DIAGNOSIS — O26893 Other specified pregnancy related conditions, third trimester: Secondary | ICD-10-CM | POA: Diagnosis not present

## 2022-11-20 DIAGNOSIS — R102 Pelvic and perineal pain: Secondary | ICD-10-CM | POA: Insufficient documentation

## 2022-11-20 DIAGNOSIS — M6281 Muscle weakness (generalized): Secondary | ICD-10-CM | POA: Insufficient documentation

## 2022-11-20 DIAGNOSIS — M62838 Other muscle spasm: Secondary | ICD-10-CM | POA: Diagnosis not present

## 2022-11-20 NOTE — Therapy (Signed)
OUTPATIENT PHYSICAL THERAPY FEMALE PELVIC EVALUATION   Patient Name: Pam Jones MRN: [id number] DOB:2001-04-14, 21 y.o., female Today's Date: 11/20/2022  END OF SESSION:  PT End of Session - 11/20/22 1108     Visit Number 1    Date for PT Re-Evaluation 02/19/23    Authorization Type [place] medicaid- [employer]    PT Start Time 1108   arrival time   PT Stop Time 1135    PT Time Calculation (min) 27 min    Activity Tolerance Patient tolerated treatment well;No increased pain    Behavior During Therapy [facility] for tasks assessed/performed             Past Medical History:  Diagnosis Date   COVID-19    04/2021   HIV (human immunodeficiency virus infection) ([place]) 03/2022   Hypertension    Past Surgical History:  Procedure Laterality Date   FEMUR FRACTURE SURGERY     Patient Active Problem List   Diagnosis Date Noted   Rash on lips 11/06/2022   Influenza-like illness 11/01/2022   LGSIL on Pap smear of cervix 07/31/2022   Supervision of high risk pregnancy, antepartum 07/05/2022   HIV (human immunodeficiency virus infection) ([place])    Obesity in pregnancy     PCP: not in chart  REFERRING PROVIDER: Elvera Maria, CNM   REFERRING DIAG: [phone] (ICD-10-CM) - Pelvic pain affecting pregnancy in third trimester, antepartum  THERAPY DIAG:  Muscle weakness (generalized)  Abnormal posture  Other muscle spasm  Unspecified lack of coordination  Rationale for Evaluation and Treatment: Rehabilitation  ONSET DATE: 20 weeks of pregnancy   SUBJECTIVE:                                                                                                                                                                                           SUBJECTIVE STATEMENT: Pressure felt at pubic bone with sitting up, urinating, walking. Starting feeling it around [redacted] weeks pregnant and now 29 weeks with first baby. Denies any other issues with pregnancy such as contractions,  loss of fluids, spotting/bleeding. No problem with bowels or bladder per pt. Is considered high risk pregnancy.      PAIN:  Are you having pain? Yes NPRS scale: 3/10 Pain location: Vaginal  Pain type: pressure  Pain description: intermittent   Aggravating factors: sitting >standing , urinating, walking Relieving factors: intermittent, worse with those activities but doesn't fully go away just stopping them   PRECAUTIONS: Other: high risk pregnancy   WEIGHT BEARING RESTRICTIONS: No  FALLS:  Has patient fallen in last 6 months? No  LIVING ENVIRONMENT: Lives with: lives with their family  Lives in: House/apartment   OCCUPATION: not currently   PLOF: Independent  PATIENT GOALS: to have less pain/pressure  PERTINENT HISTORY:  high-risk pregnancy and has Supervision of high risk pregnancy, antepartum; HIV (human immunodeficiency virus infection) ([place]); Obesity in pregnancy; LGSIL on Pap smear of cervix; Influenza-like illness; and Rash on lips  Sexual abuse: No  BOWEL MOVEMENT: Pain with bowel movement: No Type of bowel movement:Type (Bristol Stool Scale) 4, Frequency daily, and Strain No Fully empty rectum: Yes:   Leakage: No Pads: No Fiber supplement: Yes: no but eats a lot of fiber with diet   URINATION: Pain with urination: No Fully empty bladder: No does need to move a little to fully empty but does not always feel empty each time Stream: Strong Urgency: Yes: with pregnancy  Frequency: usually every hour with pregnancy, not before Leakage:  no Pads: No  INTERCOURSE: Pain with intercourse: During Penetration and After Intercourse Heaviness felt with intercourse- positional with quad Ability to have vaginal penetration:  Yes:   Climax: not painful   Marinoff Scale: 0/3   PREGNANCY: Vaginal deliveries 0 Tearing No C-section deliveries 0 Currently pregnant Yes: 29 weeks  PROLAPSE: Pt reports heaviness with transfers, walking, and urinating     OBJECTIVE:   DIAGNOSTIC FINDINGS:    [id number]  COGNITION: Overall cognitive status: Within functional limits for tasks assessed     SENSATION: Light touch: Appears intact during eval WFL however pt reports numbness with sleeping on Rt arm and goes away with mobility  Proprioception: Appears intact  MUSCLE LENGTH: Bil hamstrings and adductors limited by 25%    POSTURE: rounded shoulders, forward head, and anterior pelvic tilt   LUMBARAROM/PROM:  A/PROM A/PROM  eval  Flexion Limited by 25%  Extension WFL  Right lateral flexion Limited by 50%  Left lateral flexion Limited by 50%  Right rotation WFL  Left rotation WFL   (Blank rows = not tested)  LOWER EXTREMITY ROM:  WFL  LOWER EXTREMITY MMT:  Bil hip abduction 3+/5, all other hip 4/5, knees and ankles 5/5  PALPATION:   General  TTP at lt lower abdominal quadrant into Lt anterior pelvis, and midline pubic bone and over pubic symphysis and pt reports this replicated the "pressure and pain" she feels that brought her in                  External Perineal Exam deferred                              Internal Pelvic Floor deferred   Patient confirms identification and approves PT to assess internal pelvic floor and treatment No  PELVIC MMT:   MMT eval  Vaginal   Internal Anal Sphincter   External Anal Sphincter   Puborectalis   Diastasis Recti   (Blank rows = not tested)        TONE: Deferred   PROLAPSE: Deferred   TODAY'S TREATMENT:  DATE:   11/20/2022 EVAL Examination completed, findings reviewed, pt educated on POC, manual K-tape donned by PT at abdomen with 5 pieces for abdominal support. Pt denied all allergies including to adhesives. Pt educated on how to remove tape and to remove if there is redness, irritation or skin itching. Pt agreed and verbalized understanding. Pt  reported immediate relief of pressure felt once in standing. Pt also educated on log rolling for improved mechanics with bed mobility.  Pt motivated to participate in PT and agreeable to attempt recommendations.     If treatment provided at initial evaluation, no treatment charged due to lack of authorization.       PATIENT EDUCATION:  Education details: to be given Person educated: Patient Education method: Programmer, multimedia, Demonstration, Tactile cues, Verbal cues, and Handouts Education comprehension: verbalized understanding and returned demonstration  HOME EXERCISE PROGRAM: To be given   ASSESSMENT:  CLINICAL IMPRESSION: Patient is a 21 y.o. female  who was seen today for physical therapy evaluation and treatment for pelvic pain with pregnancy. Pt is [redacted] weeks pregnant with first baby and has been having pain at pubic bone and pressure felt vaginally since about [redacted] weeks pregnant. Pt reports it is usually about a  3/10 with walking/transferring to standing, and urinating. Pt reports she usually has to move around to fully empty bladder and sometimes doesn't feel empty. Pt reports she some pain with intercourse if in quadruped but "not bad" but is similar to pressure/pain reported. Pt denies any other symptoms at eval. Pt found to have decreased flexibility at spine and bil hips, mild bil hip weakness, mild valsalva with transferring into standing reported due to pain, and somewhat of a crunch to transfer sit<>supine. Pt educated on importance of breathing out with transfers and to log roll with bed mobility and pt agreed and demonstrated good technique after this. Pt deferred internal at this time. Pt also found to have TTP at Lt lower abdomen and into Lt anterior pelvis and midline pelvis. Worse at pubic symphysis and with palpation pt reports this is the same pain she feels with pelvic pain and pressure that brought her in. Pt denied allergies to adhesives and consented to K-tape at abdomen. Pt  tolerated well and reported immediate relief of symptoms, denies no pressure at pubic area with tape in place. Due to this, pt also educated on belly band for support. Pt verbalized understanding on tape removal and to remove with skin irritation or itching. Pt would benefit from additional PT to further address deficits.    OBJECTIVE IMPAIRMENTS: decreased coordination, decreased endurance, decreased mobility, decreased strength, increased fascial restrictions, increased muscle spasms, impaired flexibility, improper body mechanics, postural dysfunction, and pain.   ACTIVITY LIMITATIONS: transfers, bed mobility, and locomotion level  PARTICIPATION LIMITATIONS: community activity  PERSONAL FACTORS: Fitness, Time since onset of injury/illness/exacerbation, and 1 comorbidity: medical history, currently pregnant  are also affecting patient's functional outcome.   REHAB POTENTIAL: Good  CLINICAL DECISION MAKING: Evolving/moderate complexity  EVALUATION COMPLEXITY: Moderate   GOALS: Goals reviewed with patient? Yes  SHORT TERM GOALS: Target date: 12/18/22  Pt to be I with HEP.  Baseline: Goal status: INITIAL  2.  Pt to demonstrate improved coordination of pelvic floor/core with relaxation and activation with transfers and bed mobility at least 50% of the time to decrease valsalva and tension with mobility to decrease pain.  Baseline:  Goal status: INITIAL    LONG TERM GOALS: Target date: 01/02/23  Pt to be I with advanced  HEP.  Baseline:  Goal status: INITIAL  2.  Pt to demonstrate improved coordination of pelvic floor/core with relaxation and activation with transfers and bed mobility at least75% of the time to decrease valsalva and tension with mobility to decrease pain.  Baseline:  Goal status: INITIAL  3.  Pt will report no more than 2/10 pain at pelvis due to improvements in posture, strength, and muscle length and/or use of support belly band with progression of pregnancy.   Baseline:  Goal status: INITIAL  4. Pt to be I with pelvic floor relaxation techniques, diaphragmatic breathing, gentle TA activation, voiding mechanics for improved mobility and decreased pain at pelvis.  Baseline:  Goal status: INITIAL   PLAN:  PT FREQUENCY: 1x/week  PT DURATION:  8 sessions  PLANNED INTERVENTIONS: Therapeutic exercises, Therapeutic activity, Neuromuscular re-education, Patient/Family education, Self Care, Joint mobilization, Aquatic Therapy, Dry Needling, Spinal mobilization, Cryotherapy, Moist heat, Taping, Biofeedback, and Manual therapy  PLAN FOR NEXT SESSION: taping at abdomen for support as needed, belly band demonstration for support, TA activation with transfer training, diaphragmatic breathing training, quad mobility and stretching, hip strengthening, hip and spinal flexibility   Stacy Gardner, PT, DPT 11/21/2311:54 PM

## 2022-11-21 ENCOUNTER — Other Ambulatory Visit: Payer: Self-pay

## 2022-11-21 DIAGNOSIS — O0990 Supervision of high risk pregnancy, unspecified, unspecified trimester: Secondary | ICD-10-CM

## 2022-11-23 ENCOUNTER — Other Ambulatory Visit: Payer: Self-pay

## 2022-11-23 ENCOUNTER — Other Ambulatory Visit: Payer: Medicaid Other

## 2022-11-23 DIAGNOSIS — O0990 Supervision of high risk pregnancy, unspecified, unspecified trimester: Secondary | ICD-10-CM

## 2022-11-23 LAB — OB RESULTS CONSOLE HIV ANTIBODY (ROUTINE TESTING): HIV: REACTIVE

## 2022-11-26 NOTE — L&D Delivery Note (Addendum)
G1 @41 .1 weeks induced for postdates. Hx of HIV and new onset gHTN. AZT infusion given intrapartum.  Delivery Note At 14 a viable female infant was delivered via SVD, presentation: LOA. APGAR: 6, 9; weight 8'5.   SD identified. First maneuver: McRoberts, Second maneuver: woodscrew, Third maneuver: attempt posterior arm, Fourth maneuver: modified Rubin by applied pressure to the posterior aspect of the anterior shoulder then with more maternal effort the anterior shoulder delivered followed by the posterior shoulder then the baby.  Total time of dystocia: 1 min At no time was traction placed on fetal head.  Placenta status: spontaneously delivered intact with gentle cord traction. Fundus firm with massage and Pitocin.   Anesthesia: epidural Lacerations: 2nd degree Suture used for repair: 2-0 Vicryl rapide Est. Blood Loss (mL): 100 Placenta to LD Complications SD Cord ph 7.13   Mom to postpartum. Baby to Couplet care / Skin to Skin.    Julianne Handler, CNM 02/14/2023 5:22 PM

## 2022-11-27 ENCOUNTER — Ambulatory Visit (INDEPENDENT_AMBULATORY_CARE_PROVIDER_SITE_OTHER): Payer: Medicaid Other | Admitting: Advanced Practice Midwife

## 2022-11-27 ENCOUNTER — Encounter: Payer: Self-pay | Admitting: Advanced Practice Midwife

## 2022-11-27 VITALS — BP 120/80 | HR 96 | Wt 265.6 lb

## 2022-11-27 DIAGNOSIS — O0990 Supervision of high risk pregnancy, unspecified, unspecified trimester: Secondary | ICD-10-CM

## 2022-11-27 DIAGNOSIS — R102 Pelvic and perineal pain: Secondary | ICD-10-CM

## 2022-11-27 DIAGNOSIS — O98719 Human immunodeficiency virus [HIV] disease complicating pregnancy, unspecified trimester: Secondary | ICD-10-CM

## 2022-11-27 DIAGNOSIS — Z3A29 29 weeks gestation of pregnancy: Secondary | ICD-10-CM

## 2022-11-27 DIAGNOSIS — O26893 Other specified pregnancy related conditions, third trimester: Secondary | ICD-10-CM

## 2022-11-27 LAB — CBC
Hematocrit: 33.6 % — ABNORMAL LOW (ref 34.0–46.6)
Hemoglobin: 11.2 g/dL (ref 11.1–15.9)
MCH: 27.4 pg (ref 26.6–33.0)
MCHC: 33.3 g/dL (ref 31.5–35.7)
MCV: 82 fL (ref 79–97)
Platelets: 205 10*3/uL (ref 150–450)
RBC: 4.09 x10E6/uL (ref 3.77–5.28)
RDW: 12.4 % (ref 11.7–15.4)
WBC: 7.7 10*3/uL (ref 3.4–10.8)

## 2022-11-27 LAB — GLUCOSE TOLERANCE, 2 HOURS W/ 1HR
Glucose, 1 hour: 122 mg/dL (ref 70–179)
Glucose, 2 hour: 104 mg/dL (ref 70–152)
Glucose, Fasting: 95 mg/dL — ABNORMAL HIGH (ref 70–91)

## 2022-11-27 LAB — HIV 1/2 AB DIFFERENTIATION
HIV 1 Ab: REACTIVE
HIV 2 Ab: NONREACTIVE
NOTE (HIV CONF MULTIP: POSITIVE — AB

## 2022-11-27 LAB — HIV ANTIBODY (ROUTINE TESTING W REFLEX): HIV Screen 4th Generation wRfx: REACTIVE

## 2022-11-27 LAB — RPR: RPR Ser Ql: NONREACTIVE

## 2022-11-27 NOTE — Progress Notes (Signed)
       PRENATAL VISIT NOTE- Centering Pregnancy Cycle 8, Session # 6  Subjective:  Pam Jones is a 22 y.o. G1P0 at [redacted]w[redacted]d being seen today for ongoing prenatal care through Centering Pregnancy.  She is currently monitored for the following issues for this high-risk pregnancy and has Supervision of high risk pregnancy, antepartum; HIV (human immunodeficiency virus infection) ([place]); Obesity in pregnancy; LGSIL on Pap smear of cervix; Influenza-like illness; and Rash on lips on their problem list.  Patient reports  vaginal pressure occurs occasionally .  Contractions: Not present. Vag. Bleeding: None.  Movement: Present. Denies leaking of fluid/ROM.   The following portions of the patient's history were reviewed and updated as appropriate: allergies, current medications, past family history, past medical history, past social history, past surgical history and problem list. Problem list updated.  Objective:   Vitals:   11/27/22 0932  BP: 120/23  Pulse: 96  Weight: 265 lb 9.6 oz (120.5 kg)    Fetal Status: Fetal Heart Rate (bpm): 155 Fundal Height: 30 cm Movement: Present     General:  Alert, oriented and cooperative. Patient is in no acute distress.  Skin: Skin is warm and dry. No rash noted.   Cardiovascular: Normal heart rate noted  Respiratory: Normal respiratory effort, no problems with respiration noted  Abdomen: Soft, gravid, appropriate for gestational age.  Pain/Pressure: Present     Pelvic: Cervical exam deferred        Extremities: Normal range of motion.     Mental Status: Normal mood and affect. Normal behavior. Normal judgment and thought content.   Assessment and Plan:  Pregnancy: G1P0 at [redacted]w[redacted]d  1. Supervision of high risk pregnancy, antepartum  Centering Pregnancy, Session#6: Reviewed resources in [employer].  Facilitated discussion today: stages of labor, comfort measures, postpartum and newborn care, car seat/crib safety   Mindfulness activity  with positive affirmations   Fundal height and FHR appropriate today unless noted otherwise in plan. Patient to continue group care.    2. [redacted] weeks gestation of pregnancy   3. Pelvic pain affecting pregnancy in third trimester, antepartum --Intermittent vaginal pressure, worse after sitting on toilet.   --Rest/ice/heat/warm bath/increase PO fluids/Tylenol/pregnancy support belt  --Reviewed s/sx of PTL, reasons to seek care  4. HIV affecting pregnancy, antepartum --On antiviral medications, close follow up with ID      Preterm labor symptoms and general obstetric precautions including but not limited to vaginal bleeding, contractions, leaking of fluid and fetal movement were reviewed in detail with the patient. Please refer to After Visit Summary for other counseling recommendations.  Return for Centering Sessions as scheduled.  Future Appointments  Date Time Provider [place]  12/05/2022  2:30 PM [facility]URSE [facility]  12/05/2022  2:45 PM [facility] US4 [facility]  12/10/2022  9:00 AM CENTERING PROVIDER [facility]  12/24/2022  9:00 AM CENTERING PROVIDER [facility]  01/02/2023  3:30 PM Mulino Callas, NP [facility]  01/07/2023  9:00 AM CENTERING PROVIDER [facility]  01/21/2023  9:00 AM CENTERING PROVIDER [facility]    Fatima Blank, CNM

## 2022-11-28 ENCOUNTER — Encounter: Payer: Self-pay | Admitting: Advanced Practice Midwife

## 2022-12-05 ENCOUNTER — Ambulatory Visit: Payer: Medicaid Other | Attending: Maternal & Fetal Medicine

## 2022-12-05 ENCOUNTER — Ambulatory Visit: Payer: Medicaid Other | Admitting: *Deleted

## 2022-12-05 VITALS — BP 126/62 | HR 128

## 2022-12-05 DIAGNOSIS — O99210 Obesity complicating pregnancy, unspecified trimester: Secondary | ICD-10-CM | POA: Diagnosis not present

## 2022-12-05 DIAGNOSIS — O99213 Obesity complicating pregnancy, third trimester: Secondary | ICD-10-CM | POA: Diagnosis not present

## 2022-12-05 DIAGNOSIS — O98713 Human immunodeficiency virus [HIV] disease complicating pregnancy, third trimester: Secondary | ICD-10-CM

## 2022-12-05 DIAGNOSIS — O98719 Human immunodeficiency virus [HIV] disease complicating pregnancy, unspecified trimester: Secondary | ICD-10-CM | POA: Insufficient documentation

## 2022-12-05 DIAGNOSIS — B20 Human immunodeficiency virus [HIV] disease: Secondary | ICD-10-CM | POA: Diagnosis not present

## 2022-12-05 DIAGNOSIS — O285 Abnormal chromosomal and genetic finding on antenatal screening of mother: Secondary | ICD-10-CM

## 2022-12-05 DIAGNOSIS — J111 Influenza due to unidentified influenza virus with other respiratory manifestations: Secondary | ICD-10-CM | POA: Insufficient documentation

## 2022-12-05 DIAGNOSIS — O0990 Supervision of high risk pregnancy, unspecified, unspecified trimester: Secondary | ICD-10-CM | POA: Diagnosis not present

## 2022-12-05 DIAGNOSIS — E669 Obesity, unspecified: Secondary | ICD-10-CM

## 2022-12-05 DIAGNOSIS — Z3A31 31 weeks gestation of pregnancy: Secondary | ICD-10-CM

## 2022-12-05 DIAGNOSIS — D563 Thalassemia minor: Secondary | ICD-10-CM | POA: Diagnosis not present

## 2022-12-06 ENCOUNTER — Other Ambulatory Visit: Payer: Self-pay | Admitting: *Deleted

## 2022-12-06 DIAGNOSIS — O99213 Obesity complicating pregnancy, third trimester: Secondary | ICD-10-CM

## 2022-12-06 DIAGNOSIS — O98713 Human immunodeficiency virus [HIV] disease complicating pregnancy, third trimester: Secondary | ICD-10-CM

## 2022-12-10 ENCOUNTER — Ambulatory Visit (INDEPENDENT_AMBULATORY_CARE_PROVIDER_SITE_OTHER): Payer: BC Managed Care – PPO | Admitting: Advanced Practice Midwife

## 2022-12-10 VITALS — BP 122/80 | HR 96 | Wt 270.6 lb

## 2022-12-10 DIAGNOSIS — Z3A31 31 weeks gestation of pregnancy: Secondary | ICD-10-CM | POA: Diagnosis not present

## 2022-12-10 DIAGNOSIS — O98719 Human immunodeficiency virus [HIV] disease complicating pregnancy, unspecified trimester: Secondary | ICD-10-CM

## 2022-12-10 DIAGNOSIS — Z23 Encounter for immunization: Secondary | ICD-10-CM

## 2022-12-10 DIAGNOSIS — O0990 Supervision of high risk pregnancy, unspecified, unspecified trimester: Secondary | ICD-10-CM

## 2022-12-10 NOTE — Progress Notes (Signed)
PRENATAL VISIT NOTE- Centering Pregnancy Cycle 8, Session # 7  Subjective:  Pam Jones is a 22 y.o. G1P0 at [redacted]w[redacted]d being seen today for ongoing prenatal care through Centering Pregnancy.  She is currently monitored for the following issues for this high-risk pregnancy and has Supervision of high risk pregnancy, antepartum; HIV (human immunodeficiency virus infection) ([place]); Obesity in pregnancy; LGSIL on Pap smear of cervix; Influenza-like illness; and Rash on lips on their problem list.  Patient reports no complaints.   . Vag. Bleeding: None.  Movement: Present. Denies leaking of fluid/ROM.   The following portions of the patient's history were reviewed and updated as appropriate: allergies, current medications, past family history, past medical history, past social history, past surgical history and problem list. Problem list updated.  Objective:   Vitals:   12/10/22 1139  BP: 122/80  Pulse: 96  Weight: 270 lb 9.6 oz (122.7 kg)    Fetal Status: Fetal Heart Rate (bpm): 142 Fundal Height: 32 cm Movement: Present     General:  Alert, oriented and cooperative. Patient is in no acute distress.  Skin: Skin is warm and dry. No rash noted.   Cardiovascular: Normal heart rate noted  Respiratory: Normal respiratory effort, no problems with respiration noted  Abdomen: Soft, gravid, appropriate for gestational age.  Pain/Pressure: Absent     Pelvic: Cervical exam deferred        Extremities: Normal range of motion.  Edema: None  Mental Status: Normal mood and affect. Normal behavior. Normal judgment and thought content.   Assessment and Plan:  Pregnancy: G1P0 at [redacted]w[redacted]d  1. Need for diphtheria-tetanus-pertussis (Tdap) vaccine --TDAP given today  2. Supervision of high risk pregnancy, antepartum  Centering Pregnancy, Session#7: Reviewed resources in [employer].   Facilitated discussion today:  preterm labor and pregnancy warning signs, newborn procedures/hospital  procedures, breastfeeding  Mindfulness activity with mindful listening  Fundal height and FHR appropriate today unless noted otherwise in plan. Patient to continue group care.    3. HIV affecting pregnancy, antepartum --followed by ID, undetectable viral load  4. [redacted] weeks gestation of pregnancy   Preterm labor symptoms and general obstetric precautions including but not limited to vaginal bleeding, contractions, leaking of fluid and fetal movement were reviewed in detail with the patient. Please refer to After Visit Summary for other counseling recommendations.  Return for Centering Sessions as scheduled.  Future Appointments  Date Time Provider [place]  12/14/2022  9:30 AM [facility] LAB [facility]  12/24/2022  9:00 AM CENTERING PROVIDER [facility]  01/02/2023  3:30 PM Lucien Callas, NP [facility]  01/04/2023  3:15 PM W[facility] NURSE [facility]  01/04/2023  3:30 PM [facility] US2 [facility]  01/07/2023  9:00 AM CENTERING PROVIDER [facility]  01/21/2023  9:00 AM CENTERING PROVIDER [facility]    Fatima Blank, CNM

## 2022-12-11 ENCOUNTER — Other Ambulatory Visit: Payer: Self-pay

## 2022-12-11 DIAGNOSIS — O0990 Supervision of high risk pregnancy, unspecified, unspecified trimester: Secondary | ICD-10-CM

## 2022-12-14 ENCOUNTER — Other Ambulatory Visit: Payer: Medicaid Other

## 2022-12-14 DIAGNOSIS — O0990 Supervision of high risk pregnancy, unspecified, unspecified trimester: Secondary | ICD-10-CM

## 2022-12-19 LAB — GLUCOSE TOLERANCE, 2 HOURS W/ 1HR
Glucose, 1 hour: 117 mg/dL (ref 70–179)
Glucose, 2 hour: 94 mg/dL (ref 70–152)
Glucose, Fasting: 88 mg/dL (ref 70–91)

## 2022-12-19 LAB — AB SCR+ANTIBODY ID: Antibody Screen: POSITIVE — AB

## 2022-12-19 LAB — ANTIBODY SCREEN

## 2022-12-24 ENCOUNTER — Ambulatory Visit (INDEPENDENT_AMBULATORY_CARE_PROVIDER_SITE_OTHER): Payer: Medicaid Other | Admitting: Advanced Practice Midwife

## 2022-12-24 VITALS — BP 118/71 | HR 93 | Wt 275.2 lb

## 2022-12-24 DIAGNOSIS — O0993 Supervision of high risk pregnancy, unspecified, third trimester: Secondary | ICD-10-CM

## 2022-12-24 DIAGNOSIS — O98719 Human immunodeficiency virus [HIV] disease complicating pregnancy, unspecified trimester: Secondary | ICD-10-CM

## 2022-12-24 DIAGNOSIS — Z3A33 33 weeks gestation of pregnancy: Secondary | ICD-10-CM

## 2022-12-24 DIAGNOSIS — O98713 Human immunodeficiency virus [HIV] disease complicating pregnancy, third trimester: Secondary | ICD-10-CM

## 2022-12-24 DIAGNOSIS — O0990 Supervision of high risk pregnancy, unspecified, unspecified trimester: Secondary | ICD-10-CM

## 2022-12-24 NOTE — Progress Notes (Signed)
PRENATAL VISIT NOTE- Centering Pregnancy Cycle 8, Session # 8  Subjective:  Pam Jones is a 22 y.o. G1P0 at 108w5d being seen today for ongoing prenatal care through Centering Pregnancy.  She is currently monitored for the following issues for this high-risk pregnancy and has Supervision of high risk pregnancy, antepartum; HIV (human immunodeficiency virus infection) ([place]); Obesity in pregnancy; LGSIL on Pap smear of cervix; Influenza-like illness; and Rash on lips on their problem list.  Patient reports no complaints.  Contractions: Not present. Vag. Bleeding: None.  Movement: Present. Denies leaking of fluid/ROM.   The following portions of the patient's history were reviewed and updated as appropriate: allergies, current medications, past family history, past medical history, past social history, past surgical history and problem list. Problem list updated.  Objective:   Vitals:   12/24/22 1212  BP: 118/71  Pulse: 93  Weight: 275 lb 3.2 oz (124.8 kg)    Fetal Status: Fetal Heart Rate (bpm): 138 Fundal Height: 34 cm Movement: Present     General:  Alert, oriented and cooperative. Patient is in no acute distress.  Skin: Skin is warm and dry. No rash noted.   Cardiovascular: Normal heart rate noted  Respiratory: Normal respiratory effort, no problems with respiration noted  Abdomen: Soft, gravid, appropriate for gestational age.  Pain/Pressure: Absent     Pelvic: Cervical exam deferred        Extremities: Normal range of motion.  Edema: None  Mental Status: Normal mood and affect. Normal behavior. Normal judgment and thought content.   Assessment and Plan:  Pregnancy: G1P0 at [redacted]w[redacted]d  1. Supervision of high risk pregnancy, antepartum   Centering Pregnancy, Session#8: Reviewed resources in [employer].   Facilitated discussion today:  Newborn safety, perinatal mood disorders/intimate partner violence, birth planning  Fundal height and FHR appropriate today  unless noted otherwise in plan. Patient to continue group care.    2. HIV affecting pregnancy, antepartum  3. [redacted] weeks gestation of pregnancy     Preterm labor symptoms and general obstetric precautions including but not limited to vaginal bleeding, contractions, leaking of fluid and fetal movement were reviewed in detail with the patient. Please refer to After Visit Summary for other counseling recommendations.  Return in about 2 weeks (around 01/07/2023) for Centering Sessions as scheduled.  Future Appointments  Date Time Provider [place]  01/02/2023  3:30 PM Lemmerman Callas, NP [facility]  01/04/2023  3:15 PM W[facility]-MFC NURSE [facility]  01/04/2023  3:30 PM [facility] US2 [facility]  01/07/2023  9:00 AM CENTERING PROVIDER [facility]  01/21/2023  9:00 AM CENTERING PROVIDER [facility]-CWH [facility]    Fatima Blank, CNM

## 2023-01-02 ENCOUNTER — Ambulatory Visit (INDEPENDENT_AMBULATORY_CARE_PROVIDER_SITE_OTHER): Payer: Medicaid Other | Admitting: Infectious Diseases

## 2023-01-02 ENCOUNTER — Other Ambulatory Visit: Payer: Self-pay

## 2023-01-02 ENCOUNTER — Other Ambulatory Visit (HOSPITAL_COMMUNITY): Payer: Self-pay

## 2023-01-02 ENCOUNTER — Encounter: Payer: Self-pay | Admitting: Infectious Diseases

## 2023-01-02 VITALS — BP 128/83 | HR 100 | Temp 98.3°F | Resp 16 | Ht 70.0 in | Wt 278.0 lb

## 2023-01-02 DIAGNOSIS — O98719 Human immunodeficiency virus [HIV] disease complicating pregnancy, unspecified trimester: Secondary | ICD-10-CM | POA: Diagnosis not present

## 2023-01-02 DIAGNOSIS — O0990 Supervision of high risk pregnancy, unspecified, unspecified trimester: Secondary | ICD-10-CM

## 2023-01-02 DIAGNOSIS — Z3A35 35 weeks gestation of pregnancy: Secondary | ICD-10-CM | POA: Diagnosis not present

## 2023-01-02 DIAGNOSIS — B20 Human immunodeficiency virus [HIV] disease: Secondary | ICD-10-CM

## 2023-01-02 DIAGNOSIS — O98713 Human immunodeficiency virus [HIV] disease complicating pregnancy, third trimester: Secondary | ICD-10-CM | POA: Diagnosis not present

## 2023-01-02 NOTE — Patient Instructions (Signed)
Ask your OB team about the RSV vaccine at your next appointment.   Stop by the lab on your way out.

## 2023-01-02 NOTE — Progress Notes (Signed)
Name: Pam Jones  DOB: [birth year] MRN: [id number] PCP: Pcp, No     Brief Narrative:  Pam Jones is a 22 y.o. female with HIV diagnosed in May of 2023.  CD4 nadir 350 HIV Risk: sexual History of OIs: none Intake Labs: Hep B sAg (-), sAb (), cAb (); Hep A (-), Hep C (-) Quantiferon (-) HLA B*5701 () G6PD: ()   Previous Regimens: Tivicay + Descovy (during pregnancy)   Genotypes: 2023 - VL too small to detect   Subjective:   Chief Complaint  Patient presents with   Follow-up    B20      HPI: Pam Jones is here today for routine follow up care for HIV; currently pregnant and progressing nicely through the third trimester without obstetrical concerns. She has plans for vaginal delivery. She would also like to breastfeed (at least the colostrum, not sure longer term). She has questions about candidacy for delayed bathing for her newborn given benefit of vernix and neonatal microbiome.  Continues tivicay and descovy together once daily without any concerns. Has not missed a single dose.   PCN allergy - has hives, oral swelling, difficulty swallowing. Never hospitalized for this but clearly outlined some airway concerns.       01/02/2023    3:26 PM  Depression screen PHQ 2/9  Decreased Interest 0  Down, Depressed, Hopeless 0  PHQ - 2 Score 0    Review of Systems  Constitutional:  Negative for chills, fever, malaise/fatigue and weight loss.  HENT:  Negative for sore throat.        No dental problems  Respiratory:  Negative for cough and sputum production.   Cardiovascular:  Negative for chest pain and leg swelling.  Gastrointestinal:  Negative for abdominal pain, diarrhea and vomiting.  Genitourinary:  Negative for dysuria and flank pain.  Musculoskeletal:  Negative for joint pain, myalgias and neck pain.  Skin:  Negative for rash.  Neurological:  Negative for dizziness, tingling and headaches.  Psychiatric/Behavioral:  Negative for depression and  substance abuse. The patient is not nervous/anxious and does not have insomnia.     Past Medical History:  Diagnosis Date   COVID-19    04/2021   HIV (human immunodeficiency virus infection) ([place]) 03/2022   Hypertension     Outpatient Medications Prior to Visit  Medication Sig Dispense Refill   Blood Pressure Monitoring (BLOOD PRESSURE KIT) DEVI 1 Device by Does not apply route once a week. 1 each 0   DESCOVY 200-25 MG tablet Take 1 tablet by mouth daily. 30 tablet 11   Prenatal Vit-Fe Fumarate-FA (PRENATAL VITAMINS PO) Take 1 tablet by mouth daily.     TIVICAY 50 MG tablet Take 1 tablet (50 mg total) by mouth daily. 30 tablet 11   valACYclovir (VALTREX) 500 MG tablet Take 1 tablet (500 mg total) by mouth 2 (two) times daily. (Patient not taking: Reported on 01/02/2023) 60 tablet 2   No facility-administered medications prior to visit.     Allergies  Allergen Reactions   Amoxicillin Hives   Banana Itching and Swelling   Doxycycline Hives   Kiwi Extract Swelling   Nirmatrelvir Hives   Paxlovid [Nirmatrelvir-Ritonavir] Hives   Strawberry (Diagnostic) Swelling    Social History   Tobacco Use   Smoking status: Never    Passive exposure: Yes   Smokeless tobacco: Never  Vaping Use   Vaping Use: Never used  Substance Use Topics   Alcohol use: Not Currently  Comment: occasionally before pregnancy   Drug use: Not Currently    Types: Marijuana    Comment: last used before found out pregnancy    Family History  Problem Relation Age of Onset   Heart disease Mother    Hypertension Mother    Healthy Mother    Rheumatic fever Mother    Stroke Father    Hypertension Father    Hypertension Paternal Grandmother    Diabetes Paternal Grandmother    Asthma Neg Hx     Social History   Substance and Sexual Activity  Sexual Activity Yes   Birth control/protection: None     Objective:   Vitals:   01/02/23 1524  BP: 128/83  Pulse: 100  Resp: 16  Temp: 98.3 F (36.8  C)  TempSrc: Oral  SpO2: 100%  Weight: 278 lb (126.1 kg)  Height: 5\' 10"  (1.778 m)   Body mass index is 39.89 kg/m.   Physical Exam Vitals reviewed.  Constitutional:      Appearance: She is well-developed.     Comments: Seated comfortably in chair.   HENT:     Mouth/Throat:     Mouth: No oral lesions.     Dentition: Normal dentition. No dental abscesses.     Pharynx: No oropharyngeal exudate.  Cardiovascular:     Rate and Rhythm: Normal rate and regular rhythm.     Heart sounds: Normal heart sounds.  Pulmonary:     Effort: Pulmonary effort is normal.     Breath sounds: Normal breath sounds.  Abdominal:     General: There is no distension.     Palpations: Abdomen is soft.     Tenderness: There is no abdominal tenderness.  Lymphadenopathy:     Cervical: No cervical adenopathy.  Skin:    General: Skin is warm and dry.     Findings: No rash.     Comments: Skin colored fluid filled tiny bumps noted along upper lip boarder. Non inflamed appearing.   Neurological:     Mental Status: She is alert and oriented to person, place, and time.  Psychiatric:        Judgment: Judgment normal.     Lab Results Lab Results  Component Value Date   WBC 7.7 11/23/2022   HGB 11.2 11/23/2022   HCT 33.6 (L) 11/23/2022   MCV 82 11/23/2022   PLT 205 11/23/2022    Lab Results  Component Value Date   CREATININE 0.65 10/11/2022   BUN 9 10/11/2022   NA 138 10/11/2022   K 4.0 10/11/2022   CL 105 10/11/2022   CO2 25 10/11/2022    Lab Results  Component Value Date   ALT 19 10/11/2022   AST 19 10/11/2022   ALKPHOS 47 07/03/2022   BILITOT 0.3 10/11/2022    Lab Results  Component Value Date   CHOL 143 06/25/2012   HDL 51 06/25/2012   LDLCALC 79 06/25/2012   TRIG 67 06/25/2012   CHOLHDL 2.8 06/25/2012   HIV 1 RNA Quant (Copies/mL)  Date Value  10/11/2022 39 (H)  07/06/2022 21 (H)   CD4 T Cell Abs (/uL)  Date Value  11/01/2022 367 (L)     Assessment & Plan:   Problem  List Items Addressed This Visit       Unprioritized   Supervision of high risk pregnancy, antepartum    [name] is [redacted]w[redacted]d gestation today with first child/pregnancy. She is having a boy and desires vaginal delivery. She would like to breastfeed at least  in first few days of life exclusively for baby to benefit from colostrum for overall immunity and microbiome health. I think Dvora is a great candidate for this. We discussed the current recommendations to support moms with well controlled/undetectable HIV for exclusive breastfeeding in the first 3-6 months. Overall risk observed to be < 1% with exclusive breastfeeding and maternal viral suppression.   [email or web address]-odom.com/  PROMISE Study revealed 0.3% transmission at 100m and 0.6% risk at 12 months    What is not clear and consistent is the infant recommended prophylaxis regimen. The consistent benefit and risk reduction, of course is the importance of maternal adherence. We spent time discussing this today and recommendations to create a good plan for perfect adherence on regimen. I would recommend to consider the same pregnancy she has been on throughout ante- and post-partum period while breastfeeding to keep infant medication exposure consistent.   Another point I do not have an answer for today is for her question about delayed bathing for baby at least 6 but preferably 24h. I will check with our IP team and Perinatal Neonatal Hotline regarding her very good question.       HIV (human immunodeficiency virus infection) ([place])    Very well controlled on Tivicay + Descovy taken together once daily with perfect adherence.  VL today to help determine delivery plan - suspect that she will be a candidate for vaginal delivery.  FU post partum with VL check@ 53m. Can be done with [place] here or GYN team. Adherence planning discussed.       Other Visit Diagnoses      HIV affecting pregnancy, antepartum    -  Primary   Relevant Orders   HIV 1 RNA quant-no reflex-bld      Time spent during visit: 35 minutes including time spent in coordination of care and consultation with perinatal hotline for recommendations.    Janene Madeira, MSN, NP-C [facility] for [facility]ious [place] Pager: [phone] Office: [phone]  01/02/23  8:08 PM

## 2023-01-02 NOTE — Assessment & Plan Note (Signed)
[name] is [redacted]w[redacted]d gestation today with first child/pregnancy. She is having a boy and desires vaginal delivery. She would like to breastfeed at least in first few days of life exclusively for baby to benefit from colostrum for overall immunity and microbiome health. I think [name] is a great candidate for this. We discussed the current recommendations to support moms with well controlled/undetectable HIV for exclusive breastfeeding in the first 3-6 months. Overall risk observed to be < 1% with exclusive breastfeeding and maternal viral suppression.   http://[place]-odom.com/  PROMISE Study revealed 0.3% transmission at 3m and 0.6% risk at 12 months    What is not clear and consistent is the infant recommended prophylaxis regimen. The consistent benefit and risk reduction, of course is the importance of maternal adherence. We spent time discussing this today and recommendations to create a good plan for perfect adherence on regimen. I would recommend to consider the same pregnancy she has been on throughout ante- and post-partum period while breastfeeding to keep infant medication exposure consistent.   Another point I do not have an answer for today is for her question about delayed bathing for baby at least 6 but preferably 24h. I will check with our IP team and Perinatal Neonatal Hotline regarding her very good question.

## 2023-01-02 NOTE — Assessment & Plan Note (Signed)
Very well controlled on Tivicay + Descovy taken together once daily with perfect adherence.  VL today to help determine delivery plan - suspect that she will be a candidate for vaginal delivery.  FU post partum with VL check@ 62m. Can be done with [place] here or GYN team. Adherence planning discussed.

## 2023-01-04 ENCOUNTER — Ambulatory Visit: Payer: Medicaid Other

## 2023-01-04 ENCOUNTER — Ambulatory Visit: Payer: Medicaid Other | Attending: Obstetrics

## 2023-01-04 VITALS — BP 121/67 | HR 94

## 2023-01-04 DIAGNOSIS — O98713 Human immunodeficiency virus [HIV] disease complicating pregnancy, third trimester: Secondary | ICD-10-CM | POA: Insufficient documentation

## 2023-01-04 DIAGNOSIS — O0990 Supervision of high risk pregnancy, unspecified, unspecified trimester: Secondary | ICD-10-CM

## 2023-01-04 DIAGNOSIS — D563 Thalassemia minor: Secondary | ICD-10-CM

## 2023-01-04 DIAGNOSIS — O99210 Obesity complicating pregnancy, unspecified trimester: Secondary | ICD-10-CM | POA: Diagnosis not present

## 2023-01-04 DIAGNOSIS — Z3A35 35 weeks gestation of pregnancy: Secondary | ICD-10-CM | POA: Diagnosis not present

## 2023-01-04 DIAGNOSIS — B20 Human immunodeficiency virus [HIV] disease: Secondary | ICD-10-CM

## 2023-01-04 DIAGNOSIS — J111 Influenza due to unidentified influenza virus with other respiratory manifestations: Secondary | ICD-10-CM | POA: Insufficient documentation

## 2023-01-04 DIAGNOSIS — O361130 Maternal care for Anti-A sensitization, third trimester, not applicable or unspecified: Secondary | ICD-10-CM | POA: Diagnosis not present

## 2023-01-04 DIAGNOSIS — O99213 Obesity complicating pregnancy, third trimester: Secondary | ICD-10-CM | POA: Diagnosis not present

## 2023-01-04 DIAGNOSIS — E669 Obesity, unspecified: Secondary | ICD-10-CM | POA: Diagnosis not present

## 2023-01-05 LAB — HIV-1 RNA QUANT-NO REFLEX-BLD
HIV 1 RNA Quant: 31 Copies/mL — ABNORMAL HIGH
HIV-1 RNA Quant, Log: 1.49 Log cps/mL — ABNORMAL HIGH

## 2023-01-07 ENCOUNTER — Ambulatory Visit (INDEPENDENT_AMBULATORY_CARE_PROVIDER_SITE_OTHER): Payer: Medicaid Other | Admitting: Advanced Practice Midwife

## 2023-01-07 ENCOUNTER — Other Ambulatory Visit (HOSPITAL_COMMUNITY)
Admission: RE | Admit: 2023-01-07 | Discharge: 2023-01-07 | Disposition: A | Discharge status: Home / Self Care | Payer: Medicaid Other | Source: Ambulatory Visit | Attending: Family Medicine | Admitting: Family Medicine

## 2023-01-07 ENCOUNTER — Encounter: Payer: Self-pay | Admitting: Advanced Practice Midwife

## 2023-01-07 VITALS — BP 130/81 | Wt 281.0 lb

## 2023-01-07 DIAGNOSIS — O0990 Supervision of high risk pregnancy, unspecified, unspecified trimester: Secondary | ICD-10-CM | POA: Insufficient documentation

## 2023-01-07 DIAGNOSIS — O98713 Human immunodeficiency virus [HIV] disease complicating pregnancy, third trimester: Secondary | ICD-10-CM | POA: Diagnosis not present

## 2023-01-07 DIAGNOSIS — O98719 Human immunodeficiency virus [HIV] disease complicating pregnancy, unspecified trimester: Secondary | ICD-10-CM

## 2023-01-07 DIAGNOSIS — O0993 Supervision of high risk pregnancy, unspecified, third trimester: Secondary | ICD-10-CM

## 2023-01-07 DIAGNOSIS — Z3A36 36 weeks gestation of pregnancy: Secondary | ICD-10-CM

## 2023-01-07 LAB — OB RESULTS CONSOLE GC/CHLAMYDIA
Chlamydia: NEGATIVE
Neisseria Gonorrhea: NEGATIVE

## 2023-01-07 LAB — OB RESULTS CONSOLE GBS: GBS: POSITIVE

## 2023-01-08 LAB — GC/CHLAMYDIA PROBE AMP (~~LOC~~) NOT AT ARMC
Chlamydia: NEGATIVE
Comment: NEGATIVE
Comment: NORMAL
Neisseria Gonorrhea: NEGATIVE

## 2023-01-10 ENCOUNTER — Encounter: Payer: Self-pay | Admitting: General Practice

## 2023-01-10 NOTE — Progress Notes (Signed)
       PRENATAL VISIT NOTE- Centering Pregnancy Cycle 8, Session # 9  Subjective:  Pam Jones is a 22 y.o. G1P0 at 33w5dbeing seen today for ongoing prenatal care through Centering Pregnancy.  She is currently monitored for the following issues for this high-risk pregnancy and has Supervision of high risk pregnancy, antepartum; HIV (human immunodeficiency virus infection) ([place]; Obesity in pregnancy; LGSIL on Pap smear of cervix; Influenza-like illness; and Rash on lips on their problem list.  Patient reports no complaints.  Contractions: Not present.  .  Movement: Present. Denies leaking of fluid/ROM.   The following portions of the patient's history were reviewed and updated as appropriate: allergies, current medications, past family history, past medical history, past social history, past surgical history and problem list. Problem list updated.  Objective:   Vitals:   01/07/23 0923  BP: 130/81  Weight: 281 lb (127.5 kg)    Fetal Status: Fetal Heart Rate (bpm): 143 Fundal Height: 35 cm Movement: Present     General:  Alert, oriented and cooperative. Patient is in no acute distress.  Skin: Skin is warm and dry. No rash noted.   Cardiovascular: Normal heart rate noted  Respiratory: Normal respiratory effort, no problems with respiration noted  Abdomen: Soft, gravid, appropriate for gestational age.  Pain/Pressure: Absent     Pelvic: Cervical exam deferred        Extremities: Normal range of motion.  Edema: None  Mental Status: Normal mood and affect. Normal behavior. Normal judgment and thought content.   Assessment and Plan:  Pregnancy: G1P0 at 33w5d1. Supervision of high risk pregnancy, antepartum  Centering Pregnancy, Session#9: Reviewed resources in [employer]  Facilitated discussion today:  breastfeeding and labor review  with facilitated discussion. Mindfulness activity.  Fundal height and FHR appropriate today unless noted otherwise in plan.  Patient to continue group care.   - Culture, beta strep (group b only) - Cervicovaginal ancillary only( [place])  - GC/Chlamydia probe amp ([place])[facility] Strep Gp B Culture+Rflx  2. [redacted] weeks gestation of pregnancy   3. HIV affecting pregnancy, antepartum --undetectable viral load     Preterm labor symptoms and general obstetric precautions including but not limited to vaginal bleeding, contractions, leaking of fluid and fetal movement were reviewed in detail with the patient. Please refer to After Visit Summary for other counseling recommendations.  No follow-ups on file.  Future Appointments  Date Time Provider [place]3/06/2023  9:55 AM [facility]3/15/2024  8:15 AM DuRadene GunningMD [facility]  LiFatima BlankCNM

## 2023-01-11 LAB — STREP GP B CULTURE+RFLX: Strep Gp B Culture+Rflx: POSITIVE — AB

## 2023-01-11 LAB — STREP GP B SUSCEPTIBILITY

## 2023-01-18 ENCOUNTER — Other Ambulatory Visit (HOSPITAL_COMMUNITY): Payer: Self-pay

## 2023-01-21 ENCOUNTER — Ambulatory Visit (INDEPENDENT_AMBULATORY_CARE_PROVIDER_SITE_OTHER): Payer: Medicaid Other | Admitting: Advanced Practice Midwife

## 2023-01-21 VITALS — BP 118/75 | HR 97 | Wt 280.8 lb

## 2023-01-21 DIAGNOSIS — Z3A37 37 weeks gestation of pregnancy: Secondary | ICD-10-CM

## 2023-01-21 DIAGNOSIS — O0993 Supervision of high risk pregnancy, unspecified, third trimester: Secondary | ICD-10-CM

## 2023-01-21 DIAGNOSIS — O98713 Human immunodeficiency virus [HIV] disease complicating pregnancy, third trimester: Secondary | ICD-10-CM | POA: Diagnosis not present

## 2023-01-21 DIAGNOSIS — B20 Human immunodeficiency virus [HIV] disease: Secondary | ICD-10-CM | POA: Diagnosis not present

## 2023-01-21 DIAGNOSIS — O98719 Human immunodeficiency virus [HIV] disease complicating pregnancy, unspecified trimester: Secondary | ICD-10-CM

## 2023-01-21 DIAGNOSIS — O0990 Supervision of high risk pregnancy, unspecified, unspecified trimester: Secondary | ICD-10-CM

## 2023-01-21 NOTE — Progress Notes (Signed)
       PRENATAL VISIT NOTE- Centering Pregnancy Cycle 8, Session # 10  Subjective:  Pam Jones is a 22 y.o. G1P0 at 61w5dbeing seen today for ongoing prenatal care through Centering Pregnancy.  She is currently monitored for the following issues for this high-risk pregnancy and has Supervision of high risk pregnancy, antepartum; HIV [place] immunodeficiency virus infection) ([place]; Obesity in pregnancy; LGSIL on Pap smear of cervix; Influenza-like illness; and Rash on lips on their problem list.  Patient reports no complaints.  Contractions: Not present. Vag. Bleeding: None.  Movement: Present. Denies leaking of fluid/ROM.   The following portions of the patient's history were reviewed and updated as appropriate: allergies, current medications, past family history, past medical history, past social history, past surgical history and problem list. Problem list updated.  Objective:   Vitals:   01/21/23 0927  BP: 118/75  Pulse: 97  Weight: 280 lb 12.8 oz (127.4 kg)    Fetal Status: Fetal Heart Rate (bpm): 144 Fundal Height: 37 cm Movement: Present  Presentation: Vertex  General:  Alert, oriented and cooperative. Patient is in no acute distress.  Skin: Skin is warm and dry. No rash noted.   Cardiovascular: Normal heart rate noted  Respiratory: Normal respiratory effort, no problems with respiration noted  Abdomen: Soft, gravid, appropriate for gestational age.  Pain/Pressure: Absent     Pelvic: Cervical exam performed Dilation: 1 Effacement (%): 50 Station: -3  Extremities: Normal range of motion.  Edema: None  Mental Status: Normal mood and affect. Normal behavior. Normal judgment and thought content.   Assessment and Plan:  Pregnancy: G1P0 at 366w5d1. Supervision of high risk pregnancy, antepartum  Centering Pregnancy, Session#10: Reviewed resources in [employer]  Facilitated discussion today:  Review of labor, discussion about breastfeeding, cultivating  support and asking for help when needed, first weeks at home with new baby.   Fundal height and FHR appropriate today unless noted otherwise in plan. Patient to continue group care.    2. HIV affecting pregnancy, antepartum --Undetectable viral load on medications  3. [redacted] weeks gestation of pregnancy    Term labor symptoms and general obstetric precautions including but not limited to vaginal bleeding, contractions, leaking of fluid and fetal movement were reviewed in detail with the patient. Please refer to After Visit Summary for other counseling recommendations.  No follow-ups on file.  Future Appointments  Date Time Provider [place]3/06/2023  9:55 AM PaVic [facility]ipperMBaycare [facility]3/15/2024  8:15 AM DuRadene GunningMD [facility]  LiFatima BlankCNM

## 2023-02-01 ENCOUNTER — Other Ambulatory Visit: Payer: Self-pay

## 2023-02-01 ENCOUNTER — Ambulatory Visit (INDEPENDENT_AMBULATORY_CARE_PROVIDER_SITE_OTHER): Payer: Medicaid Other | Admitting: Certified Nurse Midwife

## 2023-02-01 VITALS — BP 128/72 | HR 84 | Wt 282.4 lb

## 2023-02-01 DIAGNOSIS — O0993 Supervision of high risk pregnancy, unspecified, third trimester: Secondary | ICD-10-CM

## 2023-02-01 DIAGNOSIS — R87612 Low grade squamous intraepithelial lesion on cytologic smear of cervix (LGSIL): Secondary | ICD-10-CM

## 2023-02-01 DIAGNOSIS — B20 Human immunodeficiency virus [HIV] disease: Secondary | ICD-10-CM

## 2023-02-01 DIAGNOSIS — Z3A39 39 weeks gestation of pregnancy: Secondary | ICD-10-CM

## 2023-02-01 DIAGNOSIS — B951 Streptococcus, group B, as the cause of diseases classified elsewhere: Secondary | ICD-10-CM

## 2023-02-04 NOTE — Progress Notes (Signed)
   PRENATAL VISIT NOTE  Subjective:  Pam Jones is a 22 y.o. G1P0 at [redacted]w[redacted]d being seen today for ongoing prenatal care.  She is currently monitored for the following issues for this high-risk pregnancy and has Supervision of high risk pregnancy, antepartum; HIV (human immunodeficiency virus infection) ([place]); Obesity in pregnancy; LGSIL on Pap smear of cervix; Influenza-like illness; and Rash on lips on their problem list.  Patient reports no complaints.  Contractions: Not present. Vag. Bleeding: None.  Movement: Present. Denies leaking of fluid.   The following portions of the patient's history were reviewed and updated as appropriate: allergies, current medications, past family history, past medical history, past social history, past surgical history and problem list.   Objective:   Vitals:   02/01/23 1013  BP: 128/72  Pulse: 84  Weight: 282 lb 6.4 oz (128.1 kg)    Fetal Status: Fetal Heart Rate (bpm): 145   Movement: Present     General:  Alert, oriented and cooperative. Patient is in no acute distress.  Skin: Skin is warm and dry. No rash noted.   Cardiovascular: Normal heart rate noted  Respiratory: Normal respiratory effort, no problems with respiration noted  Abdomen: Soft, gravid, appropriate for gestational age.  Pain/Pressure: Present     Pelvic: Cervical exam performed in the presence of a chaperone Dilation: Fingertip Effacement (%): Thick Station: Ballotable  Extremities: Normal range of motion.  Edema: Trace  Mental Status: Normal mood and affect. Normal behavior. Normal judgment and thought content.   Assessment and Plan:  Pregnancy: G1P0 at [redacted]w[redacted]d 1. Supervision of high risk pregnancy in third trimester - Patient feeling frequent and vigorous fetal movement   2. HIV infection, unspecified symptom status ([place]) - Patient currently on Descovy/Ticavy with an undetectable viral load. Continue with current treatment regimen.   3. Positive GBS test - Reviewed that  patient will need antibiotic therapy treatment in labor.  - Patient agreeable to plan of care.   4. [redacted] weeks gestation of pregnancy - Reviewed labor readiness with patient including the [employer], evening primrose oil, and raspberry leaf tea.    5. LGSIL on Pap smear of cervix - Follow up with repeat PAP in 12 months PP.   Term labor symptoms and general obstetric precautions including but not limited to vaginal bleeding, contractions, leaking of fluid and fetal movement were reviewed in detail with the patient. Please refer to After Visit Summary for other counseling recommendations.   Return in about 1 week (around 02/08/2023) for LOB.  Future Appointments  Date Time Provider [place]  02/08/2023  8:15 AM Radene Gunning, MD [facility]    Pam Jones) Rollene Rotunda, MSN, [place] [facility] [place]  02/04/23 10:32 PM

## 2023-02-06 NOTE — Progress Notes (Signed)
   PRENATAL VISIT NOTE  Subjective:  Pam Jones is a 22 y.o. G1P0 at [redacted]w[redacted]d being seen today for ongoing prenatal care.  She is currently monitored for the following issues for this low-risk pregnancy and has Supervision of high risk pregnancy, antepartum; HIV (human immunodeficiency virus infection) ([place]); Obesity in pregnancy; LGSIL on Pap smear of cervix; and Rash on lips on their problem list.  Patient reports no complaints.  Contractions: Not present. Vag. Bleeding: None.  Movement: Present. Denies leaking of fluid.   The following portions of the patient's history were reviewed and updated as appropriate: allergies, current medications, past family history, past medical history, past social history, past surgical history and problem list.   Objective:   Vitals:   02/08/23 0824  BP: 124/86  Pulse: (!) 120  Weight: 287 lb (130.2 kg)    Fetal Status: Fetal Heart Rate (bpm): 138   Movement: Present     General:  Alert, oriented and cooperative. Patient is in no acute distress.  Skin: Skin is warm and dry. No rash noted.   Cardiovascular: Normal heart rate noted  Respiratory: Normal respiratory effort, no problems with respiration noted  Abdomen: Soft, gravid, appropriate for gestational age.  Pain/Pressure: Absent     Pelvic: Cervical exam deferred        Extremities: Normal range of motion.  Edema: None  Mental Status: Normal mood and affect. Normal behavior. Normal judgment and thought content.   Assessment and Plan:  Pregnancy: G1P0 at [redacted]w[redacted]d 1. HIV infection, unspecified symptom status ([place]) Last check undetectable on ART  2. Supervision of high risk pregnancy, antepartum Recommended NST today. IOL scheduled for 41w. Orders placed. Allergy to amox - hives. Will try Ancef in labor.   Term labor symptoms and general obstetric precautions including but not limited to vaginal bleeding, contractions, leaking of fluid and fetal movement were reviewed in detail with the  patient. Please refer to After Visit Summary for other counseling recommendations.   No follow-ups on file.  Future Appointments  Date Time Provider [place]  02/14/2023 12:00 AM [facility] SCHED ROOM [facility] None     Radene Gunning, MD

## 2023-02-08 ENCOUNTER — Encounter: Payer: Self-pay | Admitting: Obstetrics and Gynecology

## 2023-02-08 ENCOUNTER — Ambulatory Visit (INDEPENDENT_AMBULATORY_CARE_PROVIDER_SITE_OTHER): Payer: Medicaid Other | Admitting: Obstetrics and Gynecology

## 2023-02-08 ENCOUNTER — Encounter (HOSPITAL_COMMUNITY): Payer: Self-pay | Admitting: *Deleted

## 2023-02-08 ENCOUNTER — Telehealth (HOSPITAL_COMMUNITY): Payer: Self-pay | Admitting: *Deleted

## 2023-02-08 ENCOUNTER — Other Ambulatory Visit: Payer: Self-pay

## 2023-02-08 ENCOUNTER — Other Ambulatory Visit (HOSPITAL_COMMUNITY): Payer: Self-pay | Admitting: Advanced Practice Midwife

## 2023-02-08 VITALS — BP 124/86 | HR 120 | Wt 287.0 lb

## 2023-02-08 DIAGNOSIS — O0990 Supervision of high risk pregnancy, unspecified, unspecified trimester: Secondary | ICD-10-CM

## 2023-02-08 DIAGNOSIS — Z3A40 40 weeks gestation of pregnancy: Secondary | ICD-10-CM

## 2023-02-08 DIAGNOSIS — B20 Human immunodeficiency virus [HIV] disease: Secondary | ICD-10-CM

## 2023-02-08 DIAGNOSIS — O0993 Supervision of high risk pregnancy, unspecified, third trimester: Secondary | ICD-10-CM

## 2023-02-08 NOTE — Patient Instructions (Signed)
Congratulations, you're on your way to having your baby!!!   You now have an induction scheduled.   If your induction is scheduled for midnight, you will arrive at 11:45pm on the date provided on the form form the clinical staff today. [facility] will [facility] (Entrance C) and tell them you are there for your induction. The hospital will only call you to not come if they have NO beds available and need to postpone your admission time.    You will also get a call from the pre-admission nurse to go over pre-admission screen about 2 days prior to your induction date.      

## 2023-02-08 NOTE — Telephone Encounter (Signed)
Preadmission screen  

## 2023-02-12 ENCOUNTER — Other Ambulatory Visit: Payer: Self-pay | Admitting: Advanced Practice Midwife

## 2023-02-13 ENCOUNTER — Other Ambulatory Visit: Payer: Self-pay | Admitting: Advanced Practice Midwife

## 2023-02-13 ENCOUNTER — Other Ambulatory Visit (INDEPENDENT_AMBULATORY_CARE_PROVIDER_SITE_OTHER): Payer: Medicaid Other

## 2023-02-13 ENCOUNTER — Other Ambulatory Visit: Payer: Self-pay

## 2023-02-13 ENCOUNTER — Ambulatory Visit: Payer: Medicaid Other | Admitting: Certified Nurse Midwife

## 2023-02-13 VITALS — BP 121/77 | HR 105

## 2023-02-13 DIAGNOSIS — Z3A41 41 weeks gestation of pregnancy: Secondary | ICD-10-CM | POA: Diagnosis not present

## 2023-02-13 DIAGNOSIS — O480 Post-term pregnancy: Secondary | ICD-10-CM

## 2023-02-13 NOTE — Progress Notes (Signed)
BP 121/77   Pulse (!) 105   LMP 05/02/2022

## 2023-02-13 NOTE — Progress Notes (Signed)
BP 121/77   Pulse (!) 105   LMP 05/02/2022   Dilation: 1 Effacement (%): 50 Station: -2 Presentation: Vertex  Pt here for NST/BPP (which was reactive and 8/8). Requested membrane sweep as suggested by Fatima Blank, CNM since pt has IOL scheduled tonight.  Membrane sweep performed, tolerated well. Encouraged her to have sex, take a walk and eat a good dinner before being called into the hospital tonight. Reviewed methods of induction and gave overall reassurance.  Gaylan Gerold, CNM, MSN, [place] Certified Nurse Midwife, [facility]

## 2023-02-13 NOTE — Progress Notes (Signed)

## 2023-02-14 ENCOUNTER — Inpatient Hospital Stay (HOSPITAL_COMMUNITY): Payer: Medicaid Other | Admitting: Anesthesiology

## 2023-02-14 ENCOUNTER — Other Ambulatory Visit: Payer: Self-pay

## 2023-02-14 ENCOUNTER — Encounter (HOSPITAL_COMMUNITY): Payer: Self-pay | Admitting: Obstetrics and Gynecology

## 2023-02-14 ENCOUNTER — Inpatient Hospital Stay (HOSPITAL_COMMUNITY): Payer: Medicaid Other

## 2023-02-14 ENCOUNTER — Inpatient Hospital Stay (HOSPITAL_COMMUNITY)
Admission: RE | Admit: 2023-02-14 | Discharge: 2023-02-16 | DRG: 806 | Disposition: A | Discharge status: Home / Self Care | Payer: Medicaid Other | Attending: Family Medicine | Admitting: Family Medicine

## 2023-02-14 DIAGNOSIS — Z3490 Encounter for supervision of normal pregnancy, unspecified, unspecified trimester: Secondary | ICD-10-CM

## 2023-02-14 DIAGNOSIS — O0990 Supervision of high risk pregnancy, unspecified, unspecified trimester: Principal | ICD-10-CM

## 2023-02-14 DIAGNOSIS — O134 Gestational [pregnancy-induced] hypertension without significant proteinuria, complicating childbirth: Secondary | ICD-10-CM | POA: Diagnosis not present

## 2023-02-14 DIAGNOSIS — O99824 Streptococcus B carrier state complicating childbirth: Secondary | ICD-10-CM | POA: Diagnosis present

## 2023-02-14 DIAGNOSIS — O480 Post-term pregnancy: Principal | ICD-10-CM | POA: Diagnosis present

## 2023-02-14 DIAGNOSIS — Z21 Asymptomatic human immunodeficiency virus [HIV] infection status: Secondary | ICD-10-CM | POA: Diagnosis not present

## 2023-02-14 DIAGNOSIS — O99820 Streptococcus B carrier state complicating pregnancy: Secondary | ICD-10-CM | POA: Diagnosis not present

## 2023-02-14 DIAGNOSIS — B951 Streptococcus, group B, as the cause of diseases classified elsewhere: Secondary | ICD-10-CM | POA: Diagnosis present

## 2023-02-14 DIAGNOSIS — R87612 Low grade squamous intraepithelial lesion on cytologic smear of cervix (LGSIL): Secondary | ICD-10-CM | POA: Diagnosis present

## 2023-02-14 DIAGNOSIS — Z3A41 41 weeks gestation of pregnancy: Secondary | ICD-10-CM | POA: Diagnosis not present

## 2023-02-14 DIAGNOSIS — B20 Human immunodeficiency virus [HIV] disease: Secondary | ICD-10-CM | POA: Diagnosis present

## 2023-02-14 DIAGNOSIS — O9872 Human immunodeficiency virus [HIV] disease complicating childbirth: Secondary | ICD-10-CM | POA: Diagnosis present

## 2023-02-14 DIAGNOSIS — O99210 Obesity complicating pregnancy, unspecified trimester: Secondary | ICD-10-CM

## 2023-02-14 LAB — COMPREHENSIVE METABOLIC PANEL
ALT: 30 U/L (ref 0–44)
AST: 29 U/L (ref 15–41)
Albumin: 2.6 g/dL — ABNORMAL LOW (ref 3.5–5.0)
Alkaline Phosphatase: 210 U/L — ABNORMAL HIGH (ref 38–126)
Anion gap: 9 (ref 5–15)
BUN: <5 mg/dL — ABNORMAL LOW (ref 6–20)
CO2: 21 mmol/L — ABNORMAL LOW (ref 22–32)
Calcium: 9 mg/dL (ref 8.9–10.3)
Chloride: 105 mmol/L (ref 98–111)
Creatinine, Ser: 0.93 mg/dL (ref 0.44–1.00)
GFR, Estimated: >60 mL/min (ref >60)
Glucose, Bld: 118 mg/dL — ABNORMAL HIGH (ref 70–99)
Potassium: 4.2 mmol/L (ref 3.5–5.1)
Sodium: 135 mmol/L (ref 135–145)
Total Bilirubin: 0.6 mg/dL (ref 0.3–1.2)
Total Protein: 6.2 g/dL — ABNORMAL LOW (ref 6.5–8.1)

## 2023-02-14 LAB — CBC
HCT: 38.3 % (ref 36.0–46.0)
Hemoglobin: 12.8 g/dL (ref 12.0–15.0)
MCH: 28 pg (ref 26.0–34.0)
MCHC: 33.4 g/dL (ref 30.0–36.0)
MCV: 83.8 fL (ref 80.0–100.0)
Platelets: 222 10*3/uL (ref 150–400)
RBC: 4.57 MIL/uL (ref 3.87–5.11)
RDW: 14.3 % (ref 11.5–15.5)
WBC: 8.4 10*3/uL (ref 4.0–10.5)
nRBC: 0 % (ref 0.0–0.2)

## 2023-02-14 LAB — ABO/RH: ABO/RH(D): O POS

## 2023-02-14 LAB — PROTEIN / CREATININE RATIO, URINE
Creatinine, Urine: 70 mg/dL
Total Protein, Urine: <6 mg/dL

## 2023-02-14 LAB — RPR: RPR Ser Ql: NONREACTIVE

## 2023-02-14 LAB — OB RESULTS CONSOLE RPR: RPR: NONREACTIVE

## 2023-02-14 MED ORDER — DIPHENHYDRAMINE HCL 25 MG PO CAPS: 25.0000 mg | ORAL_CAPSULE | Freq: Four times a day (QID) | ORAL | Status: DC | PRN

## 2023-02-14 MED ORDER — CEFAZOLIN SODIUM-DEXTROSE 2-4 GM/100ML-% IV SOLN
2.0000 g | Freq: Once | INTRAVENOUS | Status: AC
  Administered 2023-02-14: 2 g via INTRAVENOUS
  Filled 2023-02-14: qty 100

## 2023-02-14 MED ORDER — BENZOCAINE-MENTHOL 20-0.5 % EX AERO
1.0000 | INHALATION_SPRAY | CUTANEOUS | Status: DC | PRN
  Filled 2023-02-14 (×2): qty 56

## 2023-02-14 MED ORDER — EMTRICITABINE-TENOFOVIR AF 200-25 MG PO TABS
1.0000 | ORAL_TABLET | Freq: Every day | ORAL | Status: DC
  Administered 2023-02-15 – 2023-02-16 (×2): 1 via ORAL
  Filled 2023-02-14 (×2): qty 1

## 2023-02-14 MED ORDER — MISOPROSTOL 50MCG HALF TABLET
50.0000 ug | ORAL_TABLET | Freq: Once | ORAL | Status: AC
  Administered 2023-02-14: 50 ug via ORAL
  Filled 2023-02-14: qty 1

## 2023-02-14 MED ORDER — ZIDOVUDINE 10 MG/ML IV SOLN
2.0000 mg/kg | Freq: Once | INTRAVENOUS | Status: AC
  Administered 2023-02-14: 261 mg via INTRAVENOUS
  Filled 2023-02-14: qty 26.1

## 2023-02-14 MED ORDER — LACTATED RINGERS IV SOLN: INTRAVENOUS | Status: DC

## 2023-02-14 MED ORDER — OXYTOCIN-SODIUM CHLORIDE 30-0.9 UT/500ML-% IV SOLN
2.5000 [IU]/h | INTRAVENOUS | Status: DC
  Filled 2023-02-14: qty 500

## 2023-02-14 MED ORDER — LIDOCAINE-EPINEPHRINE (PF) 2 %-1:200000 IJ SOLN
INTRAMUSCULAR | Status: DC | PRN
  Administered 2023-02-14: 5 mL via EPIDURAL

## 2023-02-14 MED ORDER — LACTATED RINGERS IV SOLN: 500.0000 mL | Freq: Once | INTRAVENOUS | Status: DC

## 2023-02-14 MED ORDER — CEFAZOLIN SODIUM-DEXTROSE 1-4 GM/50ML-% IV SOLN
1.0000 g | Freq: Three times a day (TID) | INTRAVENOUS | Status: DC
  Administered 2023-02-14: 1 g via INTRAVENOUS
  Filled 2023-02-14 (×4): qty 50

## 2023-02-14 MED ORDER — DIPHENHYDRAMINE HCL 50 MG/ML IJ SOLN: 12.5000 mg | INTRAMUSCULAR | Status: DC | PRN

## 2023-02-14 MED ORDER — LIDOCAINE HCL URETHRAL/MUCOSAL 2 % EX GEL
1.0000 | CUTANEOUS | Status: DC | PRN
  Administered 2023-02-14: 1 via URETHRAL
  Filled 2023-02-14 (×2): qty 6

## 2023-02-14 MED ORDER — EPHEDRINE 5 MG/ML INJ: 10.0000 mg | INTRAVENOUS | Status: DC | PRN

## 2023-02-14 MED ORDER — MEASLES, MUMPS & RUBELLA VAC IJ SOLR: 0.5000 mL | Freq: Once | INTRAMUSCULAR | Status: DC

## 2023-02-14 MED ORDER — DOLUTEGRAVIR SODIUM 50 MG PO TABS
50.0000 mg | ORAL_TABLET | Freq: Every day | ORAL | Status: DC
  Administered 2023-02-14: 50 mg via ORAL
  Filled 2023-02-14: qty 1

## 2023-02-14 MED ORDER — TETANUS-DIPHTH-ACELL PERTUSSIS 5-2.5-18.5 LF-MCG/0.5 IM SUSY: 0.5000 mL | PREFILLED_SYRINGE | Freq: Once | INTRAMUSCULAR | Status: DC

## 2023-02-14 MED ORDER — SOD CITRATE-CITRIC ACID 500-334 MG/5ML PO SOLN: 30.0000 mL | ORAL | Status: DC | PRN

## 2023-02-14 MED ORDER — ONDANSETRON HCL 4 MG/2ML IJ SOLN
4.0000 mg | Freq: Four times a day (QID) | INTRAMUSCULAR | Status: DC | PRN
  Administered 2023-02-14: 4 mg via INTRAVENOUS
  Filled 2023-02-14: qty 2

## 2023-02-14 MED ORDER — PHENYLEPHRINE 80 MCG/ML (10ML) SYRINGE FOR IV PUSH (FOR BLOOD PRESSURE SUPPORT)
80.0000 ug | PREFILLED_SYRINGE | INTRAVENOUS | Status: DC | PRN
  Filled 2023-02-14: qty 10

## 2023-02-14 MED ORDER — FENTANYL-BUPIVACAINE-NACL 0.5-0.125-0.9 MG/250ML-% EP SOLN
12.0000 mL/h | EPIDURAL | Status: DC | PRN
  Administered 2023-02-14: 12 mL/h via EPIDURAL
  Filled 2023-02-14: qty 250

## 2023-02-14 MED ORDER — FENTANYL CITRATE (PF) 100 MCG/2ML IJ SOLN
50.0000 ug | INTRAMUSCULAR | Status: DC | PRN
  Administered 2023-02-14 (×3): 100 ug via INTRAVENOUS
  Filled 2023-02-14 (×3): qty 2

## 2023-02-14 MED ORDER — SENNOSIDES-DOCUSATE SODIUM 8.6-50 MG PO TABS
2.0000 | ORAL_TABLET | ORAL | Status: DC
  Administered 2023-02-15 – 2023-02-16 (×2): 2 via ORAL
  Filled 2023-02-14 (×2): qty 2

## 2023-02-14 MED ORDER — TERBUTALINE SULFATE 1 MG/ML IJ SOLN: 0.2500 mg | Freq: Once | INTRAMUSCULAR | Status: DC | PRN

## 2023-02-14 MED ORDER — ZIDOVUDINE 10 MG/ML IV SOLN
1.0000 mg/kg/h | INTRAVENOUS | Status: DC
  Administered 2023-02-14 (×3): 1 mg/kg/h via INTRAVENOUS
  Filled 2023-02-14 (×5): qty 40

## 2023-02-14 MED ORDER — WITCH HAZEL-GLYCERIN EX PADS: 1.0000 | MEDICATED_PAD | CUTANEOUS | Status: DC | PRN

## 2023-02-14 MED ORDER — PHENYLEPHRINE 80 MCG/ML (10ML) SYRINGE FOR IV PUSH (FOR BLOOD PRESSURE SUPPORT): 80.0000 ug | PREFILLED_SYRINGE | INTRAVENOUS | Status: DC | PRN

## 2023-02-14 MED ORDER — DIBUCAINE (PERIANAL) 1 % EX OINT: 1.0000 | TOPICAL_OINTMENT | CUTANEOUS | Status: DC | PRN

## 2023-02-14 MED ORDER — EMTRICITABINE-TENOFOVIR AF 200-25 MG PO TABS
1.0000 | ORAL_TABLET | Freq: Every day | ORAL | Status: DC
  Administered 2023-02-14: 1 via ORAL
  Filled 2023-02-14: qty 1

## 2023-02-14 MED ORDER — PRENATAL MULTIVITAMIN CH
1.0000 | ORAL_TABLET | Freq: Every day | ORAL | Status: DC
  Administered 2023-02-15 – 2023-02-16 (×2): 1 via ORAL
  Filled 2023-02-14 (×2): qty 1

## 2023-02-14 MED ORDER — SIMETHICONE 80 MG PO CHEW
80.0000 mg | CHEWABLE_TABLET | ORAL | Status: DC | PRN
  Administered 2023-02-15: 80 mg via ORAL
  Filled 2023-02-14: qty 1

## 2023-02-14 MED ORDER — ACETAMINOPHEN 325 MG PO TABS
650.0000 mg | ORAL_TABLET | ORAL | Status: DC | PRN
  Filled 2023-02-14: qty 2

## 2023-02-14 MED ORDER — LACTATED RINGERS IV SOLN
500.0000 mL | INTRAVENOUS | Status: DC | PRN
  Administered 2023-02-14 (×2): 500 mL via INTRAVENOUS

## 2023-02-14 MED ORDER — IBUPROFEN 600 MG PO TABS
600.0000 mg | ORAL_TABLET | Freq: Four times a day (QID) | ORAL | Status: DC
  Administered 2023-02-14 – 2023-02-16 (×8): 600 mg via ORAL
  Filled 2023-02-14 (×8): qty 1

## 2023-02-14 MED ORDER — COCONUT OIL OIL: 1.0000 | TOPICAL_OIL | Status: DC | PRN

## 2023-02-14 MED ORDER — OXYTOCIN BOLUS FROM INFUSION
333.0000 mL | Freq: Once | INTRAVENOUS | Status: AC
  Administered 2023-02-14: 333 mL via INTRAVENOUS

## 2023-02-14 MED ORDER — MISOPROSTOL 25 MCG QUARTER TABLET
25.0000 ug | ORAL_TABLET | Freq: Once | ORAL | Status: AC
  Administered 2023-02-14: 25 ug via VAGINAL
  Filled 2023-02-14: qty 1

## 2023-02-14 MED ORDER — LIDOCAINE HCL (PF) 1 % IJ SOLN
30.0000 mL | INTRAMUSCULAR | Status: AC | PRN
  Administered 2023-02-14: 30 mL via SUBCUTANEOUS
  Filled 2023-02-14: qty 30

## 2023-02-14 MED ORDER — ONDANSETRON HCL 4 MG PO TABS: 4.0000 mg | ORAL_TABLET | ORAL | Status: DC | PRN

## 2023-02-14 MED ORDER — ACETAMINOPHEN 325 MG PO TABS: 650.0000 mg | ORAL_TABLET | ORAL | Status: DC | PRN

## 2023-02-14 MED ORDER — ONDANSETRON HCL 4 MG/2ML IJ SOLN: 4.0000 mg | INTRAMUSCULAR | Status: DC | PRN

## 2023-02-14 MED ORDER — DOLUTEGRAVIR SODIUM 50 MG PO TABS
50.0000 mg | ORAL_TABLET | Freq: Every day | ORAL | Status: DC
  Administered 2023-02-15 – 2023-02-16 (×2): 50 mg via ORAL
  Filled 2023-02-14 (×2): qty 1

## 2023-02-14 NOTE — Anesthesia Preprocedure Evaluation (Addendum)
Anesthesia Evaluation  Patient identified by MRN, date of birth, ID band Patient awake    Reviewed: Allergy & Precautions, Patient's Chart, lab work & pertinent test results  Airway Mallampati: III       Dental no notable dental hx.    Pulmonary    Pulmonary exam normal        Cardiovascular Normal cardiovascular exam     Neuro/Psych    GI/Hepatic   Endo/Other    Renal/GU      Musculoskeletal   Abdominal   Peds  Hematology  (+) HIV  Anesthesia Other Findings   Reproductive/Obstetrics (+) Pregnancy                             Anesthesia Physical Anesthesia Plan  ASA: 3  Anesthesia Plan: Epidural   Post-op Pain Management:    Induction:   PONV Risk Score and Plan: 0  Airway Management Planned: Natural Airway  Additional Equipment: None  Intra-op Plan:   Post-operative Plan:   Informed Consent: I have reviewed the patients History and Physical, chart, labs and discussed the procedure including the risks, benefits and alternatives for the proposed anesthesia with the patient or authorized representative who has indicated his/her understanding and acceptance.       Plan Discussed with:   Anesthesia Plan Comments: (Lab Results      Component                Value               Date                      WBC                      8.4                 02/14/2023                HGB                      12.8                02/14/2023                HCT                      38.3                02/14/2023                MCV                      83.8                02/14/2023                PLT                      222                 02/14/2023           )        Anesthesia Quick Evaluation

## 2023-02-14 NOTE — Discharge Summary (Signed)
Postpartum Discharge Summary  Date of Service updated-3/23     Patient Name: Pam Jones DOB: 2001/02/01 MRN: [id number]  Date of admission: 02/14/2023 Delivery date:02/14/2023  Delivering provider: Julianne Handler  Date of discharge: 02/16/2023  Admitting diagnosis: Pregnancy [Z34.90] Intrauterine pregnancy: [redacted]w[redacted]d     Secondary diagnosis:  Principal Problem:   Pregnancy Active Problems:   Supervision of high risk pregnancy, antepartum   HIV (human immunodeficiency virus infection) ([place])   LGSIL on Pap smear of cervix   Positive testing for group B Streptococcus   SVD (spontaneous vaginal delivery)   Shoulder dystocia during labor and delivery  Additional problems: none    Discharge diagnosis: Term Pregnancy Delivered and Gestational Hypertension                                              Post partum procedures: none Augmentation: Cytotec and IP Foley Complications: None  Hospital course: Induction of Labor With Vaginal Delivery   22 y.o. yo G1P0 at [redacted]w[redacted]d was admitted to the hospital 02/14/2023 for induction of labor.  Indication for induction: Postdates.  Patient had an uncomplicated labor course. Membrane Rupture Time/Date: 6:40 AM ,02/14/2023   Delivery Method:Vaginal, Spontaneous  Episiotomy: None  Lacerations:  2nd degree  Details of delivery can be found in separate delivery note- delivery was complicated by shoulder dystocia.  Patient had a postpartum course complicated by gestHTN- she was treated with oral Lasix and started on Procardia XL 30mg  daily. Patient is discharged home 02/16/23.  Newborn Data: Birth date:02/14/2023  Birth time:3:50 PM  Gender:Female  Living status:Living  Apgars:6 ,9  Weight:3790 g   Magnesium Sulfate received: No BMZ received: No Rhophylac:N/A MMR:N/A T-DaP: No Flu: No Transfusion:No  Physical exam  Vitals:   02/15/23 1206 02/15/23 2341 02/16/23 0514 02/16/23 0654  BP: 126/68 119/83 134/85 137/81  Pulse: 76 77 77 67   Resp: 16  18   Temp:  98.5 F (36.9 C) 98.2 F (36.8 C)   TempSrc:  Axillary Axillary   SpO2: 100% 100% 99% 100%  Weight:      Height:       General: alert, cooperative, and no distress Lochia: appropriate Uterine Fundus: firm Incision: N/A DVT Evaluation: No evidence of DVT seen on physical exam. Labs: Lab Results  Component Value Date   WBC 8.4 02/14/2023   HGB 12.8 02/14/2023   HCT 38.3 02/14/2023   MCV 83.8 02/14/2023   PLT 222 02/14/2023      Latest Ref Rng & Units 02/14/2023    9:07 AM  CMP  Glucose 70 - 99 mg/dL 118   BUN 6 - 20 mg/dL <5   Creatinine 0.44 - 1.00 mg/dL 0.93   Sodium 135 - 145 mmol/L 135   Potassium 3.5 - 5.1 mmol/L 4.2   Chloride 98 - 111 mmol/L 105   CO2 22 - 32 mmol/L 21   Calcium 8.9 - 10.3 mg/dL 9.0   Total Protein 6.5 - 8.1 g/dL 6.2   Total Bilirubin 0.3 - 1.2 mg/dL 0.6   Alkaline Phos 38 - 126 U/L 210   AST 15 - 41 U/L 29   ALT 0 - 44 U/L 30    Edinburgh Score:    02/15/2023   11:41 PM  Edinburgh Postnatal Depression Scale Screening Tool  I have been able to laugh and see the funny  side of things. 0  I have looked forward with enjoyment to things. 0  I have blamed myself unnecessarily when things went wrong. 1  I have been anxious or worried for no good reason. 2  I have felt scared or panicky for no good reason. 1  Things have been getting on top of me. 2  I have been so unhappy that I have had difficulty sleeping. 0  I have felt sad or miserable. 1  I have been so unhappy that I have been crying. 0  The thought of harming myself has occurred to me. 0  Edinburgh Postnatal Depression Scale Total 7     After visit meds:  Allergies as of 02/16/2023       Reactions   Amoxicillin Hives   Banana Itching, Swelling   Doxycycline Hives   Kiwi Extract Swelling   Nirmatrelvir Hives   Paxlovid [nirmatrelvir-ritonavir] Hives   Strawberry (diagnostic) Swelling        Medication List     STOP taking these medications     valACYclovir 500 MG tablet Commonly known as: Valtrex       TAKE these medications    acetaminophen 325 MG tablet Commonly known as: Tylenol Take 2 tablets (650 mg total) by mouth every 4 (four) hours as needed (for pain scale < 4).   Blood Pressure Kit Devi 1 Device by Does not apply route once a week.   Descovy 200-25 MG tablet Generic drug: emtricitabine-tenofovir AF Take 1 tablet by mouth daily.   furosemide 20 MG tablet Commonly known as: LASIX Take 1 tablet (20 mg total) by mouth daily for 3 days.   ibuprofen 600 MG tablet Commonly known as: ADVIL Take 1 tablet (600 mg total) by mouth every 6 (six) hours.   NIFEdipine 30 MG 24 hr tablet Commonly known as: ADALAT CC Take 1 tablet (30 mg total) by mouth daily.   PRENATAL VITAMINS PO Take 1 tablet by mouth daily.   Tivicay 50 MG tablet Generic drug: dolutegravir Take 1 tablet (50 mg total) by mouth daily.         Discharge home in stable condition Infant Feeding: Bottle Infant Disposition:home with mother Discharge instruction: per After Visit Summary and Postpartum booklet. Activity: Advance as tolerated. Pelvic rest for 6 weeks.  Diet: routine diet Future Appointments: Future Appointments  Date Time Provider [place]  02/21/2023  2:30 PM [facility] NURSE [facility]  04/01/2023  4:15 PM Griffin Basil, MD [facility]   Follow up Visit:  [place] [facility] [place] at [facility] Follow up in 1 week(s).   Specialty: Obstetrics and Gynecology Why: Please follow up as scheduled Contact information: [address] [place] [phone]                 Please schedule this patient for a In person postpartum visit in 6 weeks with the following provider: Any provider. Additional Postpartum F/U:BP check 1 week  High risk pregnancy complicated by: HTN and HIV Delivery mode:  Vaginal, Spontaneous   Anticipated Birth Control:  Unsure/condoms   02/16/2023 Annalee Genta, DO

## 2023-02-14 NOTE — Anesthesia Procedure Notes (Signed)
Epidural Patient location during procedure: OB Start time: 02/14/2023 8:23 AM End time: 02/14/2023 8:28 AM  Staffing Anesthesiologist: Effie Berkshire, MD Performed: anesthesiologist   Preanesthetic Checklist Completed: patient identified, IV checked, site marked, risks and benefits discussed, surgical consent, monitors and equipment checked, pre-op evaluation and timeout performed  Epidural Patient position: sitting Prep: DuraPrep Patient monitoring: heart rate, continuous pulse ox and blood pressure Approach: midline Location: L3-L4 Injection technique: LOR saline  Needle:  Needle type: Tuohy  Needle gauge: 17 G Needle length: 9 cm Catheter type: closed end flexible Catheter size: 20 Guage Test dose: negative and 1.5% lidocaine  Assessment Events: blood not aspirated, no cerebrospinal fluid, injection not painful, no injection resistance and no paresthesia  Additional Notes LOR @ 6.5  Patient identified. Risks/Benefits/Options discussed with patient including but not limited to bleeding, infection, nerve damage, paralysis, failed block, incomplete pain control, headache, blood pressure changes, nausea, vomiting, reactions to medications, itching and postpartum back pain. Confirmed with bedside nurse the patient's most recent platelet count. Confirmed with patient that they are not currently taking any anticoagulation, have any bleeding history or any family history of bleeding disorders. Patient expressed understanding and wished to proceed. All questions were answered. Sterile technique was used throughout the entire procedure. Please see nursing notes for vital signs. Test dose was given through epidural catheter and negative prior to continuing to dose epidural or start infusion. Warning signs of high block given to the patient including shortness of breath, tingling/numbness in hands, complete motor block, or any concerning symptoms with instructions to call for help. Patient  was given instructions on fall risk and not to get out of bed. All questions and concerns addressed with instructions to call with any issues or inadequate analgesia.    Reason for block:procedure for pain

## 2023-02-14 NOTE — Progress Notes (Signed)
Patient requesting indwelling catheter to be removed r/t discomfort. Lidocaine jelly and readjustment attempted. Patient educated that we will have to do in and out catheter every 2 hours instead with epidural to keep bladder emptied and higher risk of infection. Patient agreed. CNM notified.   Juluis Mire, RN

## 2023-02-14 NOTE — Progress Notes (Signed)
Patient ID: Pam Jones, female   DOB: Jun 01, 2001, 22 y.o.   MRN: [id number]   Breathing w ctx; s/p cervical foley and double cytotec dose; received Fentanyl x 1; receiving AZT & Ancef; no s/s pre-e  BP 141/85, 131/85; admission BP 142/86 FHR 120s, +accels, no decels Ctx q 2-3 mins Cx deferred (was 4/70/vtx -2)  IUP@41 .1wks IOL process HIV+ New onset gHTN  Ordered CMET and urine p/c ratio Check cx in next couple of hours to determine need for Pit Continue home Tivicay and [place] CNM 02/14/2023

## 2023-02-14 NOTE — Progress Notes (Signed)
Labor Progress Note Pam Jones is a 22 y.o. G1P0 at [redacted]w[redacted]d presented for IOL for PD  S:  Comfortable with epidural.  O:  BP (!) 107/53   Pulse 81   Temp 97.7 F (36.5 C) (Oral)   Resp 18   Ht 5' 10.5" (1.791 m)   Wt 130.7 kg   LMP 05/02/2022   SpO2 100%   BMI 40.77 kg/m  EFM: baseline 120 bpm/ mod variability/ + accels/ prolonged decels  Toco/IUPC: 1-3 SVE: Dilation: 7 Effacement (%): 100 Station: -1 Presentation: Vertex Exam by:: Zigmund Daniel, CNM ?ROT, asynclitic  A/P: 22 y.o. G1P0 [redacted]w[redacted]d  1. Labor: active, progressing well 2. FWB: Cat II 3. Pain: epidural 4. gHTN-stable, labs normal 5. HIV: AZT infusing, HIV quant pending  Prolonged decel with resolution after maternal position change. Anticipate labor progress and SVD.  Julianne Handler, CNM 1:11 PM

## 2023-02-14 NOTE — H&P (Addendum)
Pam Jones is a 22 y.o. G1P0 female at 105w1d by LMP c/w 10.5wk scan presenting for IOL due to postdates.   Reports active fetal movement, contractions: irreg, vaginal bleeding: none, membranes: intact.  Initiated prenatal care at [facility] at 11.2 wks.   Most recent u/s : [redacted]w[redacted]d, EFW 27%, AFI 15.85cm, post placenta, cephalic.   This pregnancy complicated by: # HIV+ with undetectable viral load (Tivicay & Descovy) # LSIL on pap (Aug 2023) # GBS+, resistant to [place], w amox allergy (hives)  Prenatal History/Complications:  # primigravida  Past Medical History: Past Medical History:  Diagnosis Date   COVID-19    04/2021   HIV (human immunodeficiency virus infection) ([place]) 03/2022    Past Surgical History: Past Surgical History:  Procedure Laterality Date   FEMUR FRACTURE SURGERY      Obstetrical History: OB History     Gravida  1   Para      Term      Preterm      AB      Living         SAB      IAB      Ectopic      Multiple      Live Births              Social History: Social History   Socioeconomic History   Marital status: Single    Spouse name: Not on file   Number of children: Not on file   Years of education: Not on file   Highest education level: Not on file  Occupational History   Not on file  Tobacco Use   Smoking status: Never    Passive exposure: Yes   Smokeless tobacco: Never  Vaping Use   Vaping Use: Never used  Substance and Sexual Activity   Alcohol use: Not Currently    Comment: occasionally before pregnancy   Drug use: Not Currently    Types: Marijuana    Comment: last used before found out pregnancy   Sexual activity: Yes    Birth control/protection: None  Other Topics Concern   Not on file  Social History Narrative   Not on file   Social Determinants of Health   Financial Resource Strain: Not on file  Food Insecurity: No Food Insecurity (02/14/2023)   Hunger Vital Sign    Worried About Running Out of  Food in the Last Year: Never true    Ran Out of Food in the Last Year: Never true  Transportation Needs: No Transportation Needs (02/14/2023)   PRAPARE - Hydrologist (Medical): No    Lack of Transportation (Non-Medical): No  Physical Activity: Not on file  Stress: Not on file  Social Connections: Not on file    Family History: Family History  Problem Relation Age of Onset   Heart disease Mother    Hypertension Mother    Healthy Mother    Rheumatic fever Mother    Stroke Father    Hypertension Father    Hypertension Paternal Grandmother    Diabetes Paternal Grandmother    Asthma Neg Hx     Allergies: Allergies  Allergen Reactions   Amoxicillin Hives   Banana Itching and Swelling   Doxycycline Hives   Kiwi Extract Swelling   Nirmatrelvir Hives   Paxlovid [Nirmatrelvir-Ritonavir] Hives   Strawberry (Diagnostic) Swelling    Medications Prior to Admission  Medication Sig Dispense Refill Last Dose   Blood Pressure Monitoring (  BLOOD PRESSURE KIT) DEVI 1 Device by Does not apply route once a week. 1 each 0    DESCOVY 200-25 MG tablet Take 1 tablet by mouth daily. 30 tablet 11    Prenatal Vit-Fe Fumarate-FA (PRENATAL VITAMINS PO) Take 1 tablet by mouth daily.      TIVICAY 50 MG tablet Take 1 tablet (50 mg total) by mouth daily. 30 tablet 11    valACYclovir (VALTREX) 500 MG tablet Take 1 tablet (500 mg total) by mouth 2 (two) times daily. 60 tablet 2     Review of Systems  Pertinent pos/neg as indicated in HPI  Blood pressure 128/75, pulse 84, resp. rate 16, height 5' 10.5" (1.791 m), weight 130.7 kg, last menstrual period 05/02/2022. General appearance: alert, cooperative, and no distress Lungs: clear to auscultation bilaterally Heart: regular rate and rhythm Abdomen: gravid, soft, non-tender, EFW by Leopold's approximately 7lbs Extremities: 1+ edema  Fetal monitoring: FHR: 135-145 bpm, variability: moderate,  Accelerations: Present,   decelerations:  Absent Uterine activity: irreg, mild Dilation: 1 Effacement (%): 50 Station: -2 Exam by:: Gustavus Bryant RN Presentation: cephalic   Prenatal labs: ABO, Rh: --/--/O POS (03/21 0040) Antibody: POS (03/21 0040) Rubella: 2.03 (08/25 1000) RPR: Non Reactive (12/29 0841)  HBsAg: Negative (08/25 1000)  HIV: Preliminary Reactive (12/29 0841)  GBS: Positive/-- (02/12 1459)  2hr GTT: 88/117/94  Prenatal Transfer Tool  Maternal Diabetes: No Genetic Screening: Normal Maternal Ultrasounds/Referrals: Normal Fetal Ultrasounds or other Referrals:  None Maternal Substance Abuse:  No Significant Maternal Medications:  Meds include: Other: Tivicay & Descovy Significant Maternal Lab Results: Group B Strep positive and HIV positive  Results for orders placed or performed during the hospital encounter of 02/14/23 (from the past 24 hour(s))  Type and screen   Collection Time: 02/14/23 12:40 AM  Result Value Ref Range   ABO/RH(D) O POS    Antibody Screen POS    Sample Expiration      02/17/2023,2359 Performed at [facility], [place] [address], [place], [place] [address]   CBC   Collection Time: 02/14/23 12:48 AM  Result Value Ref Range   WBC 8.4 4.0 - 10.5 K/uL   RBC 4.57 3.87 - 5.11 MIL/uL   Hemoglobin 12.8 12.0 - 15.0 g/dL   HCT 38.3 36.0 - 46.0 %   MCV 83.8 80.0 - 100.0 fL   MCH 28.0 26.0 - 34.0 pg   MCHC 33.4 30.0 - 36.0 g/dL   RDW 14.3 11.5 - 15.5 %   Platelets 222 150 - 400 K/uL   nRBC 0.0 0.0 - 0.2 %     Assessment:  [redacted]w[redacted]d SIUP  G1P0  IOL postdates  HIV+  Cat 1 FHR  GBS Positive/-- (02/12 1459)  Plan:  Admit to L&D  IV pain meds/epidural prn active labor  Cervical ripening with cervical foley/cytotec (double) to start; will follow w q 4h oral cytotec or with Pitocin, depending on when foley is dislodged  AZT while on L&D; continue home Tivicay/Descovy  ID to be notified  Ancef for GBS ppx  RNA quant ordered urgent (undetectable @  36wk)  Anticipate vag delivery   Plans to bottlefeed  Contraception: unsure  Circumcision: yes  Myrtis Ser CNM 02/14/2023, 2:27 AM

## 2023-02-14 NOTE — Progress Notes (Signed)
Labor Progress Note Pam Jones is a 22 y.o. G1P0 at [redacted]w[redacted]d presented for IOL for post dates  S:  Comfortable with epidural.  O:  BP 127/71   Pulse 72   Temp 98 F (36.7 C) (Oral)   Resp 18   Ht 5' 10.5" (1.791 m)   Wt 130.7 kg   LMP 05/02/2022   SpO2 100%   BMI 40.77 kg/m  EFM: baseline 125 bpm/ mod variability/ + accels/ early, variable decels  Toco/IUPC: 1-4 SVE: Dilation: 6.5 Effacement (%): 100 Station: -1 Presentation: Vertex Exam by:: Zigmund Daniel, CNM   A/P: 22 y.o. G1P0 [redacted]w[redacted]d  1. Labor: active, progressing well 2. FWB: Cat II 3. Pain: epidural 4. HIV: AZT infusing, HIV quant pending 5. gHTN: stable, labs pending  S/p SROM. Expectant mngt. Anticipate SVD.  Pam Jones, CNM 9:30 AM

## 2023-02-15 MED ORDER — FUROSEMIDE 20 MG PO TABS
20.0000 mg | ORAL_TABLET | Freq: Every day | ORAL | Status: DC
  Administered 2023-02-15 – 2023-02-16 (×2): 20 mg via ORAL
  Filled 2023-02-15 (×2): qty 1

## 2023-02-15 NOTE — Procedures (Deleted)
Circumcision Procedure Note  Preprocedural Diagnoses: Parental desire for neonatal circumcision, normal female phallus, prophylaxis against HIV infection and other infections  Postprocedural Diagnoses:  The same. Status post routine circumcision  Procedure: Neonatal Circumcision via Mogen Clamp  Proceduralist: Nykerria Macconnell, MD  Preprocedural Counseling: Parent desires circumcision for this female infant.  Circumcision procedure details discussed, risks and benefits of procedure were also discussed.  These include but are not limited to: benefits of circumcision in men include reduction in the rates of urinary tract infection (UTI), penile cancer, sexually transmitted infections including HIV, penile inflammatory and retractile disorders, as well as easier hygiene.  Risks include bleeding , infection, injury of glans which may lead to penile deformity or urinary tract issues or Urology intervention, unsatisfactory cosmetic appearance and other potential complications related to the procedure.  It was emphasized that this is an elective procedure.  Written informed consent was obtained.  Anesthesia: 1% lidocaine local, Tylenol  EBL: Minimal  Complications: None immediate  Procedure Details:  A timeout was performed and the infant's identify verified prior to starting the procedure. The infant was laid in a supine position, and an alcohol prep was done.  A dorsal penile nerve block was performed with 1% lidocaine. The area was then cleaned with betadine and draped in sterile fashion. Two hemostats are applied at the 12 o'clock and 6 o'clock positions on the foreskin.  While maintaining traction, a third hemostat was used to sweep around the glans to release adhesions between the glans and the inner layer of mucosa avoiding between the 5 o'clock and 7 o'clock positions.   The Mogen clamp was then placed, pulling up the maximum amount of foreskin. The clamp was tilted forward to avoid injury on the  ventral part of the penis, and reinforced.  The clamp was held in place for a few minutes with excision of the foreskin atop the base plate with the scalpel. The excised foreskin was removed and discarded per hospital protocol. The clamp was released, the entire area was inspected and found to be hemostatic and free of adhesions.  A strip of gelfoam was then applied to the cut edge of the foreskin.   The patient tolerated procedure well.  Routine post circumcision orders were placed; patient will receive routine post circumcision and nursery care.   Geralda Baumgardner, MD Faculty Practice, [facility]  

## 2023-02-15 NOTE — Progress Notes (Signed)
POSTPARTUM PROGRESS NOTE  Post Partum Day 1  Subjective:  Pam Jones is a 22 y.o. G1P1001 s/p VD at [redacted]w[redacted]d.  She reports she is doing well. No acute events overnight. She denies any problems with ambulating, voiding or po intake. Denies nausea or vomiting.  Pain is well controlled.  Lochia is minimal.  Objective: Blood pressure 124/76, pulse 77, temperature 98 F (36.7 C), temperature source Oral, resp. rate 16, height 5' 10.5" (1.791 m), weight 130.7 kg, last menstrual period 05/02/2022, SpO2 100 %, unknown if currently breastfeeding.  Physical Exam:  General: alert, cooperative and no distress Chest: no respiratory distress Heart:regular rate, distal pulses intact Abdomen: soft, nontender,  Uterine Fundus: firm, appropriately tender DVT Evaluation: No calf swelling or tenderness Extremities: trace edema Skin: warm, dry  Recent Labs    02/14/23 0048  HGB 12.8  HCT 38.3    Assessment/Plan: Pam Jones is a 22 y.o. G1P1001 s/p VD at [redacted]w[redacted]d   PPD#1 - Doing well  Routine postpartum care  gHTN - blood pressures are normal.  - start lasix 20mg  X 5 days - watch blood pressures.\  HIV - stable on meds RNA quant pending   Contraception: unsure Feeding: bottle Dispo: Plan for discharge tomorrow.   LOS: 1 day   Liliane Channel MD MPH OB Fellow, [place] for [place] 02/15/2023

## 2023-02-15 NOTE — Anesthesia Postprocedure Evaluation (Signed)
Anesthesia Post Note  Patient: Pam Jones  Procedure(s) Performed: AN AD [place]     Patient location during evaluation: Mother Baby Anesthesia Type: Epidural Level of consciousness: awake and alert and oriented Pain management: satisfactory to patient Vital Signs Assessment: post-procedure vital signs reviewed and stable Respiratory status: respiratory function stable Cardiovascular status: stable Postop Assessment: no headache, no backache, epidural receding, patient able to bend at knees, no signs of nausea or vomiting, adequate PO intake and able to ambulate Anesthetic complications: no   No notable events documented.  Last Vitals:  Vitals:   02/15/23 0349 02/15/23 0843  BP: 119/64 124/76  Pulse: 70 77  Resp: 16 16  Temp: 36.7 C   SpO2: 100% 100%    Last Pain:  Vitals:   02/15/23 0843  TempSrc:   PainSc: 1    Pain Goal:                   Pam Jones

## 2023-02-15 NOTE — Progress Notes (Signed)
CSW attempted to meet with MOB; however, MOB is currently asleep. CSW will check back in at a later time.   Abundio Miu, [place] Worker [facility] Cell#: [phone]

## 2023-02-15 NOTE — Clinical Social Work Maternal (Signed)
CLINICAL SOCIAL WORK MATERNAL/CHILD NOTE  Patient Details  Name: Pam Jones MRN: [id number] Date of Birth: 03-06-2001  Date:  02/15/2023  Clinical Social Worker Initiating Note:  Abundio Miu, [place] Date/Time: Initiated:  02/15/23/1313     Child's Name:  Pam Jones   Biological Parents:  Mother, Father (Father: Arn Medal 01/01/84)   Need for Interpreter:  None   Reason for Referral:  Newly Diagnosed HIV   Address:  [address] [place] [address]    Phone number:  [phone] (home)     Additional phone number:   Household Members/Support Persons (HM/SP):   Household Member/Support Person 1   HM/SP Name Relationship DOB or Age  HM/SP -[place] mom    HM/SP -2        HM/SP -3        HM/SP -4        HM/SP -5        HM/SP -6        HM/SP -7        HM/SP -8          Natural Supports (not living in the home):  Spouse/significant other   Professional Supports: None   Employment: Ship broker   Type of Work:     Education:  Attending college   Homebound arranged:    Museum/gallery curator Resources:  Medicaid   Other Resources:  [facility]   Cultural/Religious Considerations Which May Impact Care:    Strengths:  Ability to meet basic needs  , Home prepared for child  , Pediatrician chosen   Psychotropic Medications:         Pediatrician:    [place]  Pediatrician List:   [facility] ([address])  [place]      Pediatrician Fax Number:    Risk Factors/Current Problems:  Mental Health Concerns     Cognitive State:  Able to Concentrate  , Alert  , Goal Oriented  , Linear Thinking     Mood/Affect:  Calm  , Comfortable  , Happy  , Interested     CSW Assessment: CSW met with MOB at bedside to complete assessment. CSW introduced self and explained role. MOB's phone was sitting beside her. CSW asked was MOB on the  phone, MOB reported yes and placed her phone on mute. MOB was welcoming, pleasant, and remained engaged during assessment. MOB reported that she resides with her mother and is currently enrolled in college. MOB reported that she is studying Database administrator and shared that she is close to graduating. MOB reported that she receives [facility] and has all items needed to care for infant including a car seat and crib. CSW inquired about MOB's support system, MOB reported FOB and her mom are supports.   CSW confirmed that MOB's phone was on mute. MOB reported that the other person hung up and she was not on the phone.   CSW inquired about MOB's mental health history. MOB reported that she was diagnosed with Bipolar Depression in October of 2023. MOB reported that she feels it was situational as to how she became diagnosed. MOB vaguely mentioned issues at school as to the reasoning. CSW asked if MOB felt that it was a misdiagnosis, MOB reported no. MOB described her symptoms as auditory hallucinations, sadness, and isolating. MOB denied any current symptoms and  reported that she last experienced auditory hallucinations when she was diagnosed in October of 2023. MOB reported that she is not taking any medication nor participating in therapy. MOB denied needing any therapy resources. MOB denied any additional mental health history. CSW inquired about how MOB was feeling emotionally since giving birth, MOB reported that she was feeling fine. MOB presented calm and happy. MOB did not demonstrate any acute mental health signs/symptoms. CSW assessed for safety, MOB denied SI, HI, and domestic violence.   CSW provided education regarding the baby blues period vs. perinatal mood disorders, discussed treatment and gave resources for mental health follow up if concerns arise.  CSW recommends self-evaluation during the postpartum time period using the New Mom Checklist from Postpartum Progress and encouraged MOB to contact a medical  professional if symptoms are noted at any time.    CSW provided review of Sudden Infant Death Syndrome (SIDS) precautions.    CSW explained that CSW assists with setting up speciality care for infant. MOB verbalized understanding. MOB reported that she was diagnosed in May 2023 and shared that she is currently coping well. MOB reported that it was hard at first and explained that she feels it turned out for the best as she was able to find someone that truly loves her for her.  MOB reported that FOB is aware of her diagnosis. MOB denied needing any resources/supports in relation to her diagnosis. MOB selected [facility] [phone]).    CSW completed referral and scheduled PIDS follow up appointment. Infant's follow up appointment is scheduled for 03/01/23 at 3:40pm.   CSW updated MOB regarding appointment date and time. CSW inquired about housing concerns mentioned on MOB's chart, MOB denied any housing concerns.   CSW identifies no further need for intervention and no barriers to discharge at this time.   CSW Plan/Description:  No Further Intervention Required/No Barriers to Discharge, Sudden Infant Death Syndrome (SIDS) Education, Perinatal Mood and Anxiety Disorder (PMADs) Education, Other Information/Referral to [employer], LCSW 02/15/2023, 1:15 PM

## 2023-02-16 MED ORDER — ACETAMINOPHEN 325 MG PO TABS: 650.0000 mg | ORAL_TABLET | ORAL | Status: DC | PRN

## 2023-02-16 MED ORDER — NIFEDIPINE ER 30 MG PO TB24: 30.0000 mg | ORAL_TABLET | Freq: Every day | ORAL | 0 refills | Status: DC

## 2023-02-16 MED ORDER — IBUPROFEN 600 MG PO TABS: 600.0000 mg | ORAL_TABLET | Freq: Four times a day (QID) | ORAL | 0 refills | Status: DC

## 2023-02-16 MED ORDER — FUROSEMIDE 20 MG PO TABS: 20.0000 mg | ORAL_TABLET | Freq: Every day | ORAL | 0 refills | Status: DC

## 2023-02-16 MED ORDER — NIFEDIPINE ER OSMOTIC RELEASE 30 MG PO TB24
30.0000 mg | ORAL_TABLET | Freq: Every day | ORAL | Status: DC
  Administered 2023-02-16: 30 mg via ORAL
  Filled 2023-02-16: qty 1

## 2023-02-17 LAB — TYPE AND SCREEN
ABO/RH(D): O POS
Antibody Screen: POSITIVE
Unit division: 0
Unit division: 0

## 2023-02-17 LAB — BPAM RBC
Blood Product Expiration Date: 202404182359
Blood Product Expiration Date: 202404182359
Unit Type and Rh: 5100
Unit Type and Rh: 5100

## 2023-02-19 DIAGNOSIS — Z3483 Encounter for supervision of other normal pregnancy, third trimester: Secondary | ICD-10-CM | POA: Diagnosis not present

## 2023-02-19 DIAGNOSIS — Z3482 Encounter for supervision of other normal pregnancy, second trimester: Secondary | ICD-10-CM | POA: Diagnosis not present

## 2023-02-19 LAB — HIV-1 RNA QUANT-NO REFLEX-BLD
HIV 1 RNA Quant: 40 copies/mL
LOG10 HIV-1 RNA: 1.602 log10copy/mL

## 2023-02-21 ENCOUNTER — Telehealth: Payer: Self-pay | Admitting: *Deleted

## 2023-02-21 ENCOUNTER — Ambulatory Visit: Payer: Medicaid Other

## 2023-02-21 NOTE — Telephone Encounter (Signed)
Lucee DNKA bp check. I called and informed her and offered to reschedule.I explained we are closed tomorrow for Holiday and weekend as usual.  She agreed to rescheduled bp check 4/ 2/24. She states she is doing fine ; she just forgot to write down her appointments but did write down baby's appointments.  Staci Acosta

## 2023-02-26 ENCOUNTER — Ambulatory Visit (INDEPENDENT_AMBULATORY_CARE_PROVIDER_SITE_OTHER): Payer: Medicaid Other

## 2023-02-26 ENCOUNTER — Other Ambulatory Visit: Payer: Self-pay

## 2023-02-26 VITALS — BP 119/76 | HR 78 | Wt 272.9 lb

## 2023-02-26 DIAGNOSIS — Z0130 Encounter for examination of blood pressure without abnormal findings: Secondary | ICD-10-CM

## 2023-02-26 NOTE — Progress Notes (Signed)
Blood Pressure Check Visit  Pam Jones is here for blood pressure check following spontaneous vaginal birth on 01/25/23. Discharged on short course of Lasix 20 mg x 3 days and nifedipine 30 mg daily. BP today is 119/76. Will continue current med and follow up at [facility] visit on 04/01/23 or before if needed.   Annabell Howells, RN 02/26/2023  3:32 PM

## 2023-02-28 ENCOUNTER — Telehealth (HOSPITAL_COMMUNITY): Payer: Self-pay

## 2023-02-28 NOTE — Telephone Encounter (Signed)
Preadmission testing 

## 2023-03-13 ENCOUNTER — Telehealth: Payer: Self-pay | Admitting: *Deleted

## 2023-03-13 NOTE — Telephone Encounter (Signed)
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 Wants to report Ambriella is doing well and BP is 130/80. States she is taking her nifedipine but does not think she is taking it regularly. States she educated her on this. States patient is c/o still having wrist pain and that doctor told her it would subside as her edema resolved. States she has a little swelling in her left wrist and feet but no blurred vision or dizziness. States she also reported to her navigator [place].I will forward to provider.  Nancy Fetter

## 2023-03-18 ENCOUNTER — Inpatient Hospital Stay (HOSPITAL_COMMUNITY): Payer: Medicaid Other

## 2023-03-18 ENCOUNTER — Inpatient Hospital Stay (HOSPITAL_COMMUNITY)
Admission: AD | Admit: 2023-03-18 | Discharge: 2023-03-18 | Disposition: A | Discharge status: Home / Self Care | Payer: Medicaid Other | Attending: Obstetrics & Gynecology | Admitting: Obstetrics & Gynecology

## 2023-03-18 ENCOUNTER — Encounter (HOSPITAL_COMMUNITY): Payer: Self-pay | Admitting: Obstetrics & Gynecology

## 2023-03-18 DIAGNOSIS — R42 Dizziness and giddiness: Secondary | ICD-10-CM | POA: Diagnosis not present

## 2023-03-18 DIAGNOSIS — O135 Gestational [pregnancy-induced] hypertension without significant proteinuria, complicating the puerperium: Secondary | ICD-10-CM | POA: Diagnosis not present

## 2023-03-18 DIAGNOSIS — G4489 Other headache syndrome: Secondary | ICD-10-CM | POA: Diagnosis not present

## 2023-03-18 DIAGNOSIS — I10 Essential (primary) hypertension: Secondary | ICD-10-CM | POA: Diagnosis not present

## 2023-03-18 DIAGNOSIS — O9089 Other complications of the puerperium, not elsewhere classified: Secondary | ICD-10-CM | POA: Diagnosis not present

## 2023-03-18 DIAGNOSIS — H538 Other visual disturbances: Secondary | ICD-10-CM | POA: Diagnosis not present

## 2023-03-18 DIAGNOSIS — O1495 Unspecified pre-eclampsia, complicating the puerperium: Secondary | ICD-10-CM

## 2023-03-18 LAB — URINALYSIS, ROUTINE W REFLEX MICROSCOPIC
Bacteria, UA: NONE SEEN
Bilirubin Urine: NEGATIVE
Glucose, UA: NEGATIVE mg/dL
Ketones, ur: NEGATIVE mg/dL
Leukocytes,Ua: NEGATIVE
Nitrite: NEGATIVE
Protein, ur: NEGATIVE mg/dL
Specific Gravity, Urine: 1.013 (ref 1.005–1.030)
pH: 6 (ref 5.0–8.0)

## 2023-03-18 LAB — CBC
HCT: 38.4 % (ref 36.0–46.0)
Hemoglobin: 12.5 g/dL (ref 12.0–15.0)
MCH: 27.2 pg (ref 26.0–34.0)
MCHC: 32.6 g/dL (ref 30.0–36.0)
MCV: 83.5 fL (ref 80.0–100.0)
Platelets: 240 10*3/uL (ref 150–400)
RBC: 4.6 MIL/uL (ref 3.87–5.11)
RDW: 13.1 % (ref 11.5–15.5)
WBC: 8.6 10*3/uL (ref 4.0–10.5)
nRBC: 0 % (ref 0.0–0.2)

## 2023-03-18 LAB — COMPREHENSIVE METABOLIC PANEL
ALT: 31 U/L (ref 0–44)
AST: 25 U/L (ref 15–41)
Albumin: 3.6 g/dL (ref 3.5–5.0)
Alkaline Phosphatase: 90 U/L (ref 38–126)
Anion gap: 10 (ref 5–15)
BUN: 10 mg/dL (ref 6–20)
CO2: 22 mmol/L (ref 22–32)
Calcium: 8.9 mg/dL (ref 8.9–10.3)
Chloride: 105 mmol/L (ref 98–111)
Creatinine, Ser: 0.98 mg/dL (ref 0.44–1.00)
GFR, Estimated: >60 mL/min (ref >60)
Glucose, Bld: 97 mg/dL (ref 70–99)
Potassium: 3.4 mmol/L — ABNORMAL LOW (ref 3.5–5.1)
Sodium: 137 mmol/L (ref 135–145)
Total Bilirubin: 0.6 mg/dL (ref 0.3–1.2)
Total Protein: 6.9 g/dL (ref 6.5–8.1)

## 2023-03-18 MED ORDER — KETOROLAC TROMETHAMINE 30 MG/ML IJ SOLN
30.0000 mg | Freq: Once | INTRAMUSCULAR | Status: AC
  Administered 2023-03-18: 30 mg via INTRAVENOUS
  Filled 2023-03-18: qty 1

## 2023-03-18 MED ORDER — LACTATED RINGERS IV SOLN: INTRAVENOUS | Status: DC

## 2023-03-18 NOTE — MAU Provider Note (Cosign Needed Addendum)
History     CSN: [id number]  Arrival date and time: 03/18/23 1927   None     Chief Complaint  Patient presents with   Hypertension   Headache   HPI This is a 22 year old G1P1001 4 weeks postpartum from vaginal delivery.  Her pregnancy was complicated by HIV, gestational hypertension.  She was discharged on Procardia and Lasix.  She has not been consistent with taking her Procardia - has maybe taken 5 tablets in the last 2 weeks.  She began having a severe headache around 4 PM.  She is sensitive to light and sound.  She does have a history of migraines in the past although has not had 1 in a couple of years.  She states a different than her normal migraine as this is more severe and in the frontal lobe.  She states that this is the worst headache she has ever had.  She denies neurologic deficits, although she feels generally weak.  No chest pain or shortness of breath.  OB History     Gravida  1   Para  1   Term  1   Preterm      AB      Living  1      SAB      IAB      Ectopic      Multiple  0   Live Births  1           Past Medical History:  Diagnosis Date   COVID-19    04/2021   HIV (human immunodeficiency virus infection) 03/2022    Past Surgical History:  Procedure Laterality Date   FEMUR FRACTURE SURGERY      Family History  Problem Relation Age of Onset   Heart disease Mother    Hypertension Mother    Healthy Mother    Rheumatic fever Mother    Stroke Father    Hypertension Father    Hypertension Paternal Grandmother    Diabetes Paternal Grandmother    Asthma Neg Hx     Social History   Tobacco Use   Smoking status: Never    Passive exposure: Yes   Smokeless tobacco: Never  Vaping Use   Vaping Use: Never used  Substance Use Topics   Alcohol use: Not Currently    Comment: occasionally before pregnancy   Drug use: Yes    Frequency: 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    Review of Systems Physical Exam   Blood pressure (!) 157/82, pulse 73, temperature 98 F (36.7 C), temperature source Oral, resp. rate 18, height 5' 10.5" (1.791 m), SpO2 100 %, not currently breastfeeding. Patient Vitals for the past 24 hrs:  BP Temp Temp src Pulse Resp SpO2 Height  03/18/23 2134 (!) 157/82 -- -- 73 18 100 % --  03/18/23 2030 128/73 -- -- (!) 55 -- 99 % --  03/18/23 2015 128/78 -- -- (!) 54 -- 99 % --  03/18/23 1945 136/80 -- -- 75 -- 99 % --  03/18/23 1934 (!) 143/94 98 F (36.7 C) Oral 63 18 100 % 5' 10.5" (1.791 m)  03/18/23 1930 Marland Kitchen)  143/94 -- -- 74 -- 100 % --    Physical Exam Vitals reviewed.  Constitutional:      Appearance: She is well-developed.  Eyes:     General: No visual field deficit. Cardiovascular:     Rate and Rhythm: Normal rate and regular rhythm.  Pulmonary:     Effort: Pulmonary effort is normal.     Breath sounds: Normal breath sounds.  Abdominal:     Palpations: Abdomen is soft.  Neurological:     Mental Status: She is alert and oriented to person, place, and time.     Cranial Nerves: No cranial nerve deficit or facial asymmetry.     Sensory: Sensation is intact.     Motor: Motor function is intact.     Comments: Strength in upper/lower extremities 5/5.   Psychiatric:        Mood and Affect: Mood normal.        Speech: Speech normal.        Behavior: Behavior normal.    Results for orders placed or performed during the hospital encounter of 03/18/23 (from the past 24 hour(s))  Urinalysis, Routine w reflex microscopic -Urine, Clean Catch     Status: Abnormal   Collection Time: 03/18/23  7:54 PM  Result Value Ref Range   Color, Urine YELLOW YELLOW   APPearance CLEAR CLEAR    Specific Gravity, Urine 1.013 1.005 - 1.030   pH 6.0 5.0 - 8.0   Glucose, UA NEGATIVE NEGATIVE mg/dL   Hgb urine dipstick LARGE (A) NEGATIVE   Bilirubin Urine NEGATIVE NEGATIVE   Ketones, ur NEGATIVE NEGATIVE mg/dL   Protein, ur NEGATIVE NEGATIVE mg/dL   Nitrite NEGATIVE NEGATIVE   Leukocytes,Ua NEGATIVE NEGATIVE   RBC / HPF 6-10 0 - 5 RBC/hpf   WBC, UA 0-5 0 - 5 WBC/hpf   Bacteria, UA NONE SEEN NONE SEEN   Squamous Epithelial / HPF 0-5 0 - 5 /HPF   Mucus PRESENT   Comprehensive metabolic panel     Status: Abnormal   Collection Time: 03/18/23  8:21 PM  Result Value Ref Range   Sodium 137 135 - 145 mmol/L   Potassium 3.4 (L) 3.5 - 5.1 mmol/L   Chloride 105 98 - 111 mmol/L   CO2 22 22 - 32 mmol/L   Glucose, Bld 97 70 - 99 mg/dL   BUN 10 6 - 20 mg/dL   Creatinine, Ser 1.61 0.44 - 1.00 mg/dL   Calcium 8.9 8.9 - 09.6 mg/dL   Total Protein 6.9 6.5 - 8.1 g/dL   Albumin 3.6 3.5 - 5.0 g/dL   AST 25 15 - 41 U/L   ALT 31 0 - 44 U/L   Alkaline Phosphatase 90 38 - 126 U/L   Total Bilirubin 0.6 0.3 - 1.2 mg/dL   GFR, Estimated >04 >54 mL/min   Anion gap 10 5 - 15  CBC     Status: None   Collection Time: 03/18/23  8:21 PM  Result Value Ref Range   WBC 8.6 4.0 - 10.5 K/uL   RBC 4.60 3.87 - 5.11 MIL/uL   Hemoglobin 12.5 12.0 - 15.0 g/dL   HCT 09.8 11.9 - 14.7 %   MCV 83.5 80.0 - 100.0 fL   MCH 27.2 26.0 - 34.0 pg   MCHC 32.6 30.0 - 36.0 g/dL   RDW 82.9 56.2 - 13.0 %   Platelets 240 150 - 400 K/uL   nRBC 0.0 0.0 - 0.2 %   CT HEAD WO CONTRAST (  )  Result Date: 03/18/2023 CLINICAL DATA:  Headache, sudden, severe EXAM: CT HEAD WITHOUT CONTRAST TECHNIQUE: Contiguous axial images were obtained from the base of the skull through the vertex without intravenous contrast. RADIATION DOSE REDUCTION: This exam was performed according to the departmental dose-optimization program which includes automated exposure control, adjustment of the mA and/or kV according to patient size and/or use of  iterative reconstruction technique. COMPARISON:  10/28/2003 FINDINGS: Brain: No acute intracranial abnormality. Specifically, no hemorrhage, hydrocephalus, mass lesion, acute infarction, or significant intracranial injury. Vascular: No hyperdense vessel or unexpected calcification. Skull: No acute calvarial abnormality. Sinuses/Orbits: No acute findings Other: None IMPRESSION: Normal study. Electronically Signed   By: Charlett Nose M.D.   On: 03/18/2023 21:16    MAU Course  Procedures  MDM Check CBC, CMP, UA. Will obtain CT head as this is her "worst headache ever". Will give toradol  IV now.  Care turned over to Donette Larry, CNM at 8pm. Pam Jones 03/18/23 2000  2130: Labs and imaging unremarkable. HA almost completely resolved. Recommend consistent daily dosing of Procardia. Has follow up in office. PEC precautions. Assessment and Plan   Gestational HTN, postpartum Non intractable headache  Discharge home Follow up at [facility] as scheduled Return precautions  Allergies as of 03/18/2023       Reactions   Amoxicillin Hives   Banana Itching, Swelling   Doxycycline Hives   Kiwi Extract Swelling   Nirmatrelvir Hives   Paxlovid [nirmatrelvir-ritonavir] Hives   Strawberry (diagnostic) Swelling        Medication List     TAKE these medications    acetaminophen 325 MG tablet Commonly known as: Tylenol Take 2 tablets (650 mg total) by mouth every 4 (four) hours as needed (for pain scale < 4).   aspirin EC 81 MG tablet Take 81 mg by mouth daily. Swallow whole.   Blood Pressure Kit Devi 1 Device by Does not apply route once a week.   Descovy 200-25 MG tablet Generic drug: emtricitabine-tenofovir AF Take 1 tablet by mouth daily.   ibuprofen 600 MG tablet Commonly known as: ADVIL Take 1 tablet (600 mg total) by mouth every 6 (six) hours.   NIFEdipine 30 MG 24 hr tablet Commonly known as: ADALAT CC Take 1 tablet (30 mg total) by mouth daily.   PRENATAL VITAMINS  PO Take 1 tablet by mouth daily.   Tivicay 50 MG tablet Generic drug: dolutegravir Take 1 tablet (50 mg total) by mouth daily.       Donette Larry, CNM  03/19/2023 6:17 AM

## 2023-03-18 NOTE — MAU Note (Signed)
.  Pam Jones is a 22 y.o. at PPVag 4wks1d here in MAU reporting: arrived to MAU via EMS with a c/o HA and severe BP of 140/79 that started around 1600. Pt reports she is taking BP medications, last dose around 1200 today. Pt report taking aspirin  around 1730, but throw it up.Pt reports having hx of migraines. Pt also reports edema in her legs since delivery, and blurry vision that started today.  Onset of complaint: 1600 Pain score: 6/10 Vitals:   03/18/23 1934  BP: (!) 143/94  Pulse: 63  Resp: 18  Temp: 98 F (36.7 C)  SpO2: 100%      Lab orders placed from triage:

## 2023-04-01 ENCOUNTER — Ambulatory Visit (INDEPENDENT_AMBULATORY_CARE_PROVIDER_SITE_OTHER): Payer: Medicaid Other | Admitting: Obstetrics and Gynecology

## 2023-04-01 ENCOUNTER — Other Ambulatory Visit: Payer: Self-pay

## 2023-04-01 ENCOUNTER — Other Ambulatory Visit (HOSPITAL_COMMUNITY)
Admission: RE | Admit: 2023-04-01 | Discharge: 2023-04-01 | Disposition: A | Discharge status: Home / Self Care | Payer: Medicaid Other | Source: Ambulatory Visit | Attending: Obstetrics and Gynecology | Admitting: Obstetrics and Gynecology

## 2023-04-01 DIAGNOSIS — R87612 Low grade squamous intraepithelial lesion on cytologic smear of cervix (LGSIL): Secondary | ICD-10-CM | POA: Insufficient documentation

## 2023-04-01 NOTE — Progress Notes (Signed)
Post Partum Visit Note  Pam Jones is a 22 y.o. G21P1001 female who presents for a postpartum visit. She is 6 weeks postpartum following a normal spontaneous vaginal delivery.  I have fully reviewed the prenatal and intrapartum course. The delivery was at 41/1 gestational weeks.  Anesthesia: epidural. Postpartum course has been uncomplicated. Baby is doing well. Baby is feeding by bottle - Enfamil AR. Bleeding no bleeding. Bowel function is normal. Bladder function is normal. Patient is not sexually active. Contraception method is none. Postpartum depression screening: negative.   Upstream - 04/01/23 1622       Pregnancy Intention Screening   Does the patient want to become pregnant in the next year? No    Does the patient's partner want to become pregnant in the next year? No    Would the patient like to discuss contraceptive options today? Yes            The pregnancy intention screening data noted above was reviewed. Potential methods of contraception were discussed. The patient elected to proceed with No data recorded.   Edinburgh Postnatal Depression Scale - 04/01/23 1620       Edinburgh Postnatal Depression Scale:  In the Past 7 Days   I have looked forward with enjoyment to things. 1    I have blamed myself unnecessarily when things went wrong. 2    I have been anxious or worried for no good reason. 0    I have felt scared or panicky for no good reason. 0    Things have been getting on top of me. 1    I have been so unhappy that I have had difficulty sleeping. 0    I have felt sad or miserable. 0    I have been so unhappy that I have been crying. 0    The thought of harming myself has occurred to me. 0             Health Maintenance Due  Topic Date Due   COVID-19 Vaccine (1) Never done   HPV VACCINES (2 - Risk 3-dose series) 09/10/2013    The following portions of the patient's history were reviewed and updated as appropriate: allergies, current  medications, past family history, past medical history, past social history, past surgical history, and problem list.  Review of Systems Pertinent items are noted in HPI.  Objective:  BP 113/81   Pulse 81   Ht 5' 10.5" (1.791 m)   Wt 277 lb 8 oz (125.9 kg)   LMP 03/09/2023   BMI 39.25 kg/m    General:  alert, no distress, and moderately obese   Breasts:  not indicated  Lungs: clear to auscultation bilaterally  Heart:  regular rate and rhythm  Abdomen: soft, non-tender; bowel sounds normal; no masses,  no organomegaly   Wound N/a  GU exam:  normal       Assessment:      Normal  postpartum exam.   Plan:   Essential components of care per ACOG recommendations:  1.  Mood and well being: Patient with negative depression screening today. Reviewed local resources for support.  - Patient tobacco use? No.   - hx of drug use? No.    2. Infant care and feeding:  -Patient currently breastmilk feeding? No.  -Social determinants of health (SDOH) reviewed in EPIC. No concerns.  3. Sexuality, contraception and birth spacing - Patient does not want a pregnancy in the next year.  Desired family  size is 2 children.  - Reviewed reproductive life planning. Reviewed contraceptive methods based on pt preferences and effectiveness.  Patient desired IUD or IUS today.   - Discussed birth spacing of 18 months  4. Sleep and fatigue -Encouraged family/partner/community support of 4 hrs of uninterrupted sleep to help with mood and fatigue  5. Physical Recovery  - Discussed patients delivery and complications. She describes her labor as good. - Patient had a  vaginal delivery with 1 minute shoulder dystocia . Patient had a 2nd degree laceration. Perineal healing reviewed. Patient expressed understanding - Patient has urinary incontinence? No. - Patient is safe to resume physical and sexual activity  6.  Health Maintenance - HM due items addressed Yes - Last pap smear  Diagnosis  Date Value  Ref Range Status  07/20/2022 - Low grade squamous intraepithelial lesion (LSIL) (A)  Final   Pap smear done at today's visit.  -Breast Cancer screening indicated? No.   7. Chronic Disease/Pregnancy Condition follow up:  HIV  - PCP follow up  Warden Fillers, MD Center for [facility], [facility]

## 2023-04-04 LAB — CYTOLOGY - PAP

## 2023-04-05 ENCOUNTER — Ambulatory Visit: Payer: Medicaid Other | Admitting: Infectious Diseases

## 2023-04-05 ENCOUNTER — Encounter: Payer: Self-pay | Admitting: Obstetrics and Gynecology

## 2023-04-05 DIAGNOSIS — B9689 Other specified bacterial agents as the cause of diseases classified elsewhere: Secondary | ICD-10-CM

## 2023-04-05 MED ORDER — METRONIDAZOLE 0.75 % VA GEL
1.0000 | Freq: Every day | VAGINAL | 0 refills | Status: DC
Start: 2023-04-05 — End: 2023-09-23

## 2023-04-05 MED ORDER — DOLUTEGRAVIR-LAMIVUDINE 50-300 MG PO TABS: 1.0000 | ORAL_TABLET | Freq: Every day | ORAL | 11 refills | Status: DC

## 2023-04-16 ENCOUNTER — Other Ambulatory Visit: Payer: Self-pay

## 2023-04-16 DIAGNOSIS — I10 Essential (primary) hypertension: Secondary | ICD-10-CM

## 2023-04-16 NOTE — Progress Notes (Signed)
Referral placed for PCP.   Pam Jones

## 2023-04-26 ENCOUNTER — Other Ambulatory Visit: Payer: Self-pay | Admitting: Obstetrics & Gynecology

## 2023-05-06 ENCOUNTER — Ambulatory Visit (INDEPENDENT_AMBULATORY_CARE_PROVIDER_SITE_OTHER): Payer: Medicaid Other | Admitting: Internal Medicine

## 2023-05-06 ENCOUNTER — Encounter: Payer: Self-pay | Admitting: Internal Medicine

## 2023-05-06 VITALS — BP 124/80 | HR 81 | Temp 98.1°F | Ht 70.0 in | Wt 277.0 lb

## 2023-05-06 DIAGNOSIS — I10 Essential (primary) hypertension: Secondary | ICD-10-CM | POA: Diagnosis not present

## 2023-05-06 DIAGNOSIS — Z3009 Encounter for other general counseling and advice on contraception: Secondary | ICD-10-CM | POA: Insufficient documentation

## 2023-05-06 DIAGNOSIS — Z7689 Persons encountering health services in other specified circumstances: Secondary | ICD-10-CM

## 2023-05-06 HISTORY — DX: Essential (primary) hypertension: I10

## 2023-05-06 MED ORDER — NIFEDIPINE ER 30 MG PO TB24
30.0000 mg | ORAL_TABLET | Freq: Every day | ORAL | 1 refills | Status: DC
Start: 2023-05-06 — End: 2023-11-04

## 2023-05-06 NOTE — Assessment & Plan Note (Signed)
Discussed contraceptive options, including COC's, injectables, and IUD's. Discussed risks of COC's with HTN.  Info provided with d/c paperwork.  Patient prefers to wait at this time and will continue to think about which type of contraception she prefers. She will call her OBGYN if she decides to do IUD.

## 2023-05-06 NOTE — Assessment & Plan Note (Signed)
Nifedipine 30mg  PO daily  Check BP at home 2-3 times per week Return to clinic if BP remains elevated or becomes too low with medication.

## 2023-05-06 NOTE — Progress Notes (Signed)
[facility] [address] [place] [address] Dept: [phone] Dept Fax: [phone]  New Patient Office Visit  Subjective:   Pam Jones 2001-10-30 05/06/2023  Chief Complaint  Patient presents with   Hypertension   Establish Care    HPI: ARNESIA EYESTONE presents today to establish care at [facility]. Introduced to Publishing rights manager role and practice setting.  All questions answered.   Last annual physical: 1.5 year ago approximately.  Concerns: see below   HYPERTENSION: [name] presents for the medical management of hypertension.  Patient's current hypertension medication regimen is: nifedipine 30mg  PO daily  Patient is not currently taking prescribed medications for HTN. Recently postpartum x 2 months. Hx of HTN, was put on medication immediately after pregnancy, but stopped taking. Then BP elevated. Needs to restart medication.  Patient is regularly keeping a check on BP at home. 154/90.  Associated headaches Denies dizziness, CP, SHOB, vision changes.   BP Readings from Last 3 Encounters:  05/06/23 124/80  04/01/23 113/81  03/18/23 (!) 157/82    CONTRACEPTION:  Patient would like to discuss contraceptive options. Would prefer to avoid Nexplanon or COC's.  Questions on IUD and risks.    The following portions of the patient's history were reviewed and updated as appropriate: past medical history, past surgical history, family history, social history, allergies, medications, and problem list.   Patient Active Problem List   Diagnosis Date Noted   Primary hypertension 05/06/2023   Contraceptive education 05/06/2023   Pregnancy 02/14/2023   Positive testing for group B Streptococcus 02/14/2023   SVD (spontaneous vaginal delivery) 02/14/2023   Shoulder dystocia during labor and delivery 02/14/2023   Rash on lips 11/06/2022   LGSIL on Pap smear of cervix  07/31/2022   Supervision of high risk pregnancy, antepartum 07/05/2022   HIV (human immunodeficiency virus infection) (HCC)    Obesity in pregnancy    Past Medical History:  Diagnosis Date   COVID-19    04/2021   HIV (human immunodeficiency virus infection) (HCC) 03/2022   Primary hypertension 05/06/2023   Past Surgical History:  Procedure Laterality Date   FEMUR FRACTURE SURGERY     Family History  Problem Relation Age of Onset   Heart disease Mother    Hypertension Mother    Healthy Mother    Rheumatic fever Mother    Stroke Father    Hypertension Father    Hypertension Paternal Grandmother    Diabetes Paternal Grandmother    Asthma Neg Hx    Outpatient Medications Prior to Visit  Medication Sig Dispense Refill   Blood Pressure Monitoring (BLOOD PRESSURE KIT) DEVI 1 Device by Does not apply route once a week. 1 each 0   dolutegravir-lamiVUDine (DOVATO) 50-300 MG tablet Take 1 tablet by mouth daily. 30 tablet 11   metroNIDAZOLE (METROGEL) 0.75 % vaginal gel Place 1 Applicatorful vaginally at bedtime. 70 g 0   Prenatal Vit-Fe Fumarate-FA (PRENATAL VITAMINS PO) Take 1 tablet by mouth daily.     acetaminophen (TYLENOL) 325 MG tablet Take 2 tablets (650 mg total) by mouth every 4 (four) hours as needed (for pain scale < 4). (Patient not taking: Reported on 05/06/2023)     aspirin EC 81 MG tablet Take 81 mg by mouth daily. Swallow whole. (Patient not taking: Reported on 05/06/2023)     ibuprofen (ADVIL) 600 MG tablet Take 1 tablet (600 mg total) by mouth every 6 (six) hours. (Patient not  taking: Reported on 05/06/2023) 30 tablet 0   NIFEdipine (ADALAT CC) 30 MG 24 hr tablet Take 1 tablet (30 mg total) by mouth daily. 30 tablet 0   No facility-administered medications prior to visit.   Allergies  Allergen Reactions   Amoxicillin Hives   Banana Itching and Swelling   Doxycycline Hives   Kiwi Extract Swelling   Nirmatrelvir Hives   Paxlovid [Nirmatrelvir-Ritonavir] Hives    Strawberry (Diagnostic) Swelling    ROS: A complete ROS was performed with pertinent positives/negatives noted in the HPI. The remainder of the ROS are negative.   Objective:   Today's Vitals   05/06/23 0823  BP: 124/80  Pulse: 81  Temp: 98.1 F (36.7 C)  TempSrc: Temporal  SpO2: 99%  Weight: 277 lb (125.6 kg)  Height: 5\' 10"  (1.778 m)    GENERAL: Well-appearing, in NAD. Well nourished.  SKIN: Pink, warm and dry. No rash, lesion, ulceration, or ecchymoses.  NECK: Trachea midline. Full ROM w/o pain or tenderness. No lymphadenopathy.  RESPIRATORY: Chest wall symmetrical. Respirations even and non-labored. Breath sounds clear to auscultation bilaterally.  CARDIAC: S1, S2 present, regular rate and rhythm. Peripheral pulses 2+ bilaterally.  MSK: Muscle tone and strength appropriate for age.  EXTREMITIES: Without clubbing, cyanosis, or edema.  NEUROLOGIC: No motor or sensory deficits. Steady, even gait.  PSYCH/MENTAL STATUS: Alert, oriented x 3. Cooperative, appropriate mood and affect.   Health Maintenance Due  Topic Date Due   HPV VACCINES (2 - Risk 3-dose series) 09/10/2013    No results found for any visits on 05/06/23.     Assessment & Plan:  Encounter to establish care  Primary hypertension Assessment & Plan: Nifedipine 30mg  PO daily  Check BP at home 2-3 times per week Return to clinic if BP remains elevated or becomes too low with medication.    Orders: -     NIFEdipine ER; Take 1 tablet (30 mg total) by mouth daily.  Dispense: 90 tablet; Refill: 1  Contraceptive education Assessment & Plan: Discussed contraceptive options, including COC's, injectables, and IUD's. Discussed risks of COC's with HTN.  Info provided with d/c paperwork.  Patient prefers to wait at this time and will continue to think about which type of contraception she prefers. She will call her OBGYN if she decides to do IUD.       Return in about 3 months (around 08/06/2023) for fasting  annual physical exam.   Salvatore Decent, FNP

## 2023-05-11 DIAGNOSIS — H52223 Regular astigmatism, bilateral: Secondary | ICD-10-CM | POA: Diagnosis not present

## 2023-05-28 ENCOUNTER — Other Ambulatory Visit: Payer: Self-pay

## 2023-05-28 ENCOUNTER — Encounter: Payer: Self-pay | Admitting: Infectious Diseases

## 2023-05-28 ENCOUNTER — Ambulatory Visit (INDEPENDENT_AMBULATORY_CARE_PROVIDER_SITE_OTHER): Payer: Medicaid Other | Admitting: Infectious Diseases

## 2023-05-28 VITALS — BP 127/81 | HR 81 | Temp 98.1°F | Wt 282.0 lb

## 2023-05-28 DIAGNOSIS — Z3009 Encounter for other general counseling and advice on contraception: Secondary | ICD-10-CM

## 2023-05-28 DIAGNOSIS — R87612 Low grade squamous intraepithelial lesion on cytologic smear of cervix (LGSIL): Secondary | ICD-10-CM

## 2023-05-28 DIAGNOSIS — B20 Human immunodeficiency virus [HIV] disease: Secondary | ICD-10-CM | POA: Diagnosis not present

## 2023-05-28 DIAGNOSIS — Z30011 Encounter for initial prescription of contraceptive pills: Secondary | ICD-10-CM

## 2023-05-28 MED ORDER — NORETHINDRONE 0.35 MG PO TABS
1.0000 | ORAL_TABLET | Freq: Every day | ORAL | 11 refills | Status: DC
Start: 2023-05-28 — End: 2023-07-03

## 2023-05-28 NOTE — Patient Instructions (Addendum)
Continue the Dovato.   Will start you on the progesterone only pills for contraception. See the attached information.   Pam Jones we can do a video chat to see how you are doing on your birthcontrol pills and you are OK to continue them.   In person visit in December with labs same day  Next pap smear is due in   May 2025  Will also check to see If you need a hepatitis b booster vaccine today with blood work - CDC recommends a booster for all adults if immunity faded from our childhood vaccines.

## 2023-05-28 NOTE — Progress Notes (Signed)
Name: Pam Jones  DOB: 06/08/2001 MRN: [id number] PCP: Salvatore Decent, FNP    Brief Narrative:  Pam Jones is a 22 y.o. female with HIV diagnosed in May of 2023.  CD4 nadir 350 HIV Risk: sexual History of OIs: none Intake Labs: Hep B sAg (-), sAb (), cAb (); Hep A (-), Hep C (-) Quantiferon (-) HLA B*5701 () G6PD: ()   Previous Regimens: Tivicay + Descovy (during pregnancy 2024)  Dovato   Genotypes: 2023 - VL too small to detect    Subjective:   CC: HIV follow up care Contraception discussion - wishes to go back on OCPs.     HPI: Pam Jones is here today for routine follow up care for HIV. Her infant son is doing well and growing beautifully.   Tolerating her dovato well and happy to repeat labs today - would like to see where things are for her treatment.   She would like to go back on oral contraceptives - she used Camila (progesterone only) in the past and liked that. Felt it was weight neutral for her, did not cause any breakthrough bleeding or trouble. She also has a history of migraines and would prefer to stay away from estrogen component.        05/28/2023    8:31 AM  Depression screen PHQ 2/9  Decreased Interest 0  Down, Depressed, Hopeless 0  PHQ - 2 Score 0    Review of Systems  Constitutional:  Negative for chills, fever, malaise/fatigue and weight loss.  HENT:  Negative for sore throat.        No dental problems  Respiratory:  Negative for cough and sputum production.   Cardiovascular:  Negative for chest pain and leg swelling.  Gastrointestinal:  Negative for abdominal pain, diarrhea and vomiting.  Genitourinary:  Negative for dysuria and flank pain.  Musculoskeletal:  Negative for joint pain, myalgias and neck pain.  Skin:  Negative for rash.  Neurological:  Negative for dizziness, tingling and headaches.  Psychiatric/Behavioral:  Negative for depression and substance abuse. The patient is not nervous/anxious and does not have  insomnia.     Past Medical History:  Diagnosis Date   COVID-19    04/2021   HIV (human immunodeficiency virus infection) (HCC) 03/2022   Primary hypertension 05/06/2023    Outpatient Medications Prior to Visit  Medication Sig Dispense Refill   Blood Pressure Monitoring (BLOOD PRESSURE KIT) DEVI 1 Device by Does not apply route once a week. 1 each 0   dolutegravir-lamiVUDine (DOVATO) 50-300 MG tablet Take 1 tablet by mouth daily. 30 tablet 11   metroNIDAZOLE (METROGEL) 0.75 % vaginal gel Place 1 Applicatorful vaginally at bedtime. 70 g 0   NIFEdipine (ADALAT CC) 30 MG 24 hr tablet Take 1 tablet (30 mg total) by mouth daily. 90 tablet 1   acetaminophen (TYLENOL) 325 MG tablet Take 2 tablets (650 mg total) by mouth every 4 (four) hours as needed (for pain scale < 4). (Patient not taking: Reported on 05/06/2023)     aspirin EC 81 MG tablet Take 81 mg by mouth daily. Swallow whole. (Patient not taking: Reported on 05/06/2023)     ibuprofen (ADVIL) 600 MG tablet Take 1 tablet (600 mg total) by mouth every 6 (six) hours. (Patient not taking: Reported on 05/06/2023) 30 tablet 0   Prenatal Vit-Fe Fumarate-FA (PRENATAL VITAMINS PO) Take 1 tablet by mouth daily. (Patient not taking: Reported on 05/28/2023)     No facility-administered medications prior to  visit.     Allergies  Allergen Reactions   Amoxicillin Hives   Banana Itching and Swelling   Doxycycline Hives   Kiwi Extract Swelling   Nirmatrelvir Hives   Paxlovid [Nirmatrelvir-Ritonavir] Hives   Strawberry (Diagnostic) Swelling    Social History   Tobacco Use   Smoking status: Never    Passive exposure: Yes   Smokeless tobacco: Never  Vaping Use   Vaping Use: Never used  Substance Use Topics   Alcohol use: Not Currently    Comment: occasionally before pregnancy   Drug use: Yes    Frequency: 7.0 times per week    Types: Marijuana    Comment: Daily    Social History   Substance and Sexual Activity  Sexual Activity Not  Currently   Birth control/protection: None     Objective:   Vitals:   05/28/23 0826  BP: 127/81  Pulse: 81  Temp: 98.1 F (36.7 C)  TempSrc: Oral  SpO2: 99%  Weight: 282 lb (127.9 kg)   Body mass index is 40.46 kg/m.   Physical Exam Vitals reviewed.  Constitutional:      Appearance: She is well-developed.     Comments: Seated comfortably in chair.   HENT:     Mouth/Throat:     Mouth: No oral lesions.     Dentition: Normal dentition. No dental abscesses.     Pharynx: No oropharyngeal exudate.  Cardiovascular:     Rate and Rhythm: Normal rate and regular rhythm.     Heart sounds: Normal heart sounds.  Pulmonary:     Effort: Pulmonary effort is normal.     Breath sounds: Normal breath sounds.  Abdominal:     General: There is no distension.     Palpations: Abdomen is soft.     Tenderness: There is no abdominal tenderness.  Lymphadenopathy:     Cervical: No cervical adenopathy.  Skin:    General: Skin is warm and dry.     Findings: No rash.     Comments: Skin colored fluid filled tiny bumps noted along upper lip boarder. Non inflamed appearing.   Neurological:     Mental Status: She is alert and oriented to person, place, and time.  Psychiatric:        Judgment: Judgment normal.     Lab Results Lab Results  Component Value Date   WBC 8.6 03/18/2023   HGB 12.5 03/18/2023   HCT 38.4 03/18/2023   MCV 83.5 03/18/2023   PLT 240 03/18/2023    Lab Results  Component Value Date   CREATININE 0.98 03/18/2023   BUN 10 03/18/2023   NA 137 03/18/2023   K 3.4 (L) 03/18/2023   CL 105 03/18/2023   CO2 22 03/18/2023    Lab Results  Component Value Date   ALT 31 03/18/2023   AST 25 03/18/2023   ALKPHOS 90 03/18/2023   BILITOT 0.6 03/18/2023    Lab Results  Component Value Date   CHOL 143 06/25/2012   HDL 51 06/25/2012   LDLCALC 79 06/25/2012   TRIG 67 06/25/2012   CHOLHDL 2.8 06/25/2012   HIV 1 RNA Quant  Date Value  02/14/2023 40 copies/mL   01/02/2023 31 Copies/mL (H)  10/11/2022 39 Copies/mL (H)   CD4 T Cell Abs (/uL)  Date Value  11/01/2022 367 (L)     Assessment & Plan:   Problem List Items Addressed This Visit       Unprioritized   Contraceptive education    She  would like to go ahead and resume the progesterone only pills she used before for contraception. LMP was about 1.5 weeks ago. We discussed various start methods, recommendation for barrier method over the first month on oral contraception. She has a history of migraines and I agree should stay from estrogens so not to augment that problem.  FU in 15m over video chat.       HIV (human immunodeficiency virus infection) (HCC) - Primary    Virologically under excellent control on Tivicay + Descovy and now back on Dovato since giving birth. Immunologically doing well with CD4 in December 367 - will repeat this for her today.  She is not breastfeeding her son currently. She had a pap smear with LSIL in May 2024 - I discussed recommendation to proceed with repeating that in 1 year given HIV history.  Will check for hep b surface antibody today and proceed with booster vaccine if needed.  No need for sexual health testing today after discussion.  FU with me in December with repeated labs.       Relevant Orders   HIV 1 RNA quant-no reflex-bld   T-helper cells (CD4) count   Hepatitis B surface antibody,qualitative   LGSIL on Pap smear of cervix    FU recommended in 1 year - I adjusted her health maintenance reminder in EMR.       Other Visit Diagnoses     Encounter for initial prescription of contraceptive pills       Relevant Medications   norethindrone (CAMILA) 0.35 MG tablet       Rexene Alberts, MSN, NP-C [facility] for Infectious Disease [facility] Pager: [phone] Office: [phone]  05/28/23  9:13 AM

## 2023-05-28 NOTE — Assessment & Plan Note (Signed)
She would like to go ahead and resume the progesterone only pills she used before for contraception. LMP was about 1.5 weeks ago. We discussed various start methods, recommendation for barrier method over the first month on oral contraception. She has a history of migraines and I agree should stay from estrogens so not to augment that problem.  FU in 49m over video chat.

## 2023-05-28 NOTE — Assessment & Plan Note (Signed)
Virologically under excellent control on Tivicay + Descovy and now back on Dovato since giving birth. Immunologically doing well with CD4 in December 367 - will repeat this for her today.  She is not breastfeeding her son currently. She had a pap smear with LSIL in May 2024 - I discussed recommendation to proceed with repeating that in 1 year given HIV history.  Will check for hep b surface antibody today and proceed with booster vaccine if needed.  No need for sexual health testing today after discussion.  FU with me in December with repeated labs.

## 2023-05-28 NOTE — Assessment & Plan Note (Signed)
FU recommended in 1 year - I adjusted her health maintenance reminder in EMR.

## 2023-05-29 LAB — T-HELPER CELLS (CD4) COUNT (NOT AT ARMC)
CD4 % Helper T Cell: 39 % (ref 33–65)
CD4 T Cell Abs: 484 /uL (ref 400–1790)

## 2023-05-30 LAB — HIV-1 RNA QUANT-NO REFLEX-BLD
HIV 1 RNA Quant: <20 Copies/mL — ABNORMAL HIGH
HIV-1 RNA Quant, Log: <1.3 Log cps/mL — ABNORMAL HIGH

## 2023-05-30 LAB — HEPATITIS B SURFACE ANTIBODY,QUALITATIVE: Hep B S Ab: REACTIVE — AB

## 2023-06-09 ENCOUNTER — Other Ambulatory Visit: Payer: Self-pay | Admitting: Infectious Diseases

## 2023-06-10 NOTE — Telephone Encounter (Signed)
Ok to refills? Med d/c from her chart when she was in the hospital

## 2023-07-03 ENCOUNTER — Encounter: Payer: Self-pay | Admitting: Internal Medicine

## 2023-07-03 ENCOUNTER — Ambulatory Visit (INDEPENDENT_AMBULATORY_CARE_PROVIDER_SITE_OTHER): Payer: Medicaid Other | Admitting: Internal Medicine

## 2023-07-03 VITALS — BP 104/70 | HR 79 | Temp 98.2°F | Ht 70.0 in | Wt 279.6 lb

## 2023-07-03 DIAGNOSIS — N939 Abnormal uterine and vaginal bleeding, unspecified: Secondary | ICD-10-CM

## 2023-07-03 DIAGNOSIS — Z30019 Encounter for initial prescription of contraceptives, unspecified: Secondary | ICD-10-CM | POA: Diagnosis not present

## 2023-07-03 LAB — POCT PREGNANCY, URINE

## 2023-07-03 MED ORDER — NORETHINDRONE 0.35 MG PO TABS
1.0000 | ORAL_TABLET | Freq: Every day | ORAL | 3 refills | Status: DC
Start: 2023-07-03 — End: 2023-09-23

## 2023-07-03 NOTE — Patient Instructions (Signed)
Starting Your Oral Contraception:  1) First Day Start - Take your first pill during the first 24 hours of your menstrual cycle. No back-up contraceptivemethod is needed when the pill is started the first day of your menses.  2) Sunday Start - Wait until the first Sunday after your menstrual cycle begins to take your first pill. With this optionuse another method of birth control for the first 7 days of the first cycle only.  3) "Quick Start" - Start the pill today. If you have had unprotected sexual intercourse since your last period, perform a pregnancy test prior to starting the pill. If it is negative, start the pill today. Use another method of birth control such as condoms or spermicide the first seven days of the first cycle of use.  Oral Contraception Answers to Common Questions.   1)  Take your birth control pill at the same time every day. This ensures that a constant hormone level is maintained at all times.  2) Should you experience nausea or vomiting, try taking your pill after a meal or at bedtime.  3)  Irregular vaginal bleeding or spotting may occur while you are taking the pills, especially during the first few months of oral contraceptive use. If you experience spotting or break-through bleeding, (bleeding that occurs outside of the placebo week) that occurs after the first 3 cycles, or lasts for more than a few days, see your health care provider.  4) Birth control pills do not protect you from sexually transmitted infections.  INSTRUCTIONS FOR MISSED PILLS: 1 active pill < 24 hours late in any week: Take 1 active pill ASAP* and continue pack as usual.  Missed 1 or more active pills (i.e., >24 hours late): If during week 1: Take 1 active pill ASAP* and continue pack as usual. Back-up contraception for 7 days. Consider Emergency Contraception (EC) if unprotected intercourse occurred within the  5 days prior to missing pill.  If during week 2 or 3 and missed < 3  pills: Take 1 active pill ASAP* and continue active pills as usual, but discard placebo pills and start a new pack  If during week 2 or 3 and ? 3 pills missed: Take 1 active pill ASAP* and continue active pills as usual, but discard placebo pills and start a new pack Back-up contraception for 7 days. Consider EC if repeated or prolonged omission, or if unprotected intercourse occurred during the time the pills were missed and up until seven active pills have been taken Instructions for missed extended or continuous hormonal contraceptives:  Missed pill after 21 consecutive days of extended or continuous use, up to 7 days can be missed.  If > 7 days missed, instructions would be the same as for cyclic users who have missed/delayed combined hormonal contraceptive in the first week of use.  When the extended/continuous regimen is resumed, recommendations for cyclic users for missed/delayed combined hormonal contraceptive during the first 21 consecutive days of use should be followed.  **If you still are not sure what to do about the pills you have missed, use a back-up method anytime you have sex. Keep taking one pill each day until you can reach your doctor or clinic.  MEDICATIONS AND BIRTH CONTROL PILLS: The following medications and supplements may interfere with the effectiveness of CHC:  some antibiotics  anticonvulsants  St. John's Wort  Provigil  It is recommend that if you take the above medications and supplements and are sexually active with a female partner,   you use a back-up method of birth control while using the medication and for 7 consecutive days once the medication is completed.  IF YOU EXPERIENCE ANY OF THE FOLLOWING, PLEASE CALL IMMEDIATELY OR GO TO THE EMERGENCY ROOM:  severe abdominal pain or tenderness in the lower abdomen chest pain, sharp, sudden shortness of breath or coughing up blood headache, severe and sudden, or vomiting, dizziness or faintness eyesight  problems, such as sudden blurred or doubled vision or flashes of light severe pain or swelling in calf or groin  

## 2023-07-03 NOTE — Progress Notes (Signed)
[facility]ARE [facility] [address] [place] [address] Dept: [phone] Dept Fax: [phone]  Acute Care Office Visit  Subjective:   Pam Jones 2001/08/04 07/03/2023  Chief Complaint  Patient presents with   Vaginal Bleeding    Started July 16     HPI: Pam Jones is a 22 yo F who presents complaining of vaginal bleeding.  Patient saw her infectious disease provider on 05/28/2019 for management of HIV infection.  At that time she was placed on oral contraceptive Camila (progesterone only pill), which she has been on prior and has done well with.  She has a history of migraines and prefers to stay away from the estrogen component.  Patient states she picked up the prescription and started taking it on July 7.  On July 16 patient started vaginal bleeding, similar to her normal cycle flow.  Prior to initiation of OCPs, patient's last period was June 21 through 25, with expected next period estimated to be July 19.  Since July 16, the vaginal bleeding has continued, but has had times where no bleeding has occurred.  She is currently changing her pad 3 times a day.  She reports she has not missed any doses of birth control, nor has she been late in taking any of the pills.  She has now stopped taking the birth control as of August 2.  She is sexually active with female partner, does not always use protection.     The following portions of the patient's history were reviewed and updated as appropriate: past medical history, past surgical history, family history, social history, allergies, medications, and problem list.   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   Supervision of high risk pregnancy,  antepartum 07/05/2022   HIV (human immunodeficiency virus infection) (HCC)    Past Medical History:  Diagnosis Date   COVID-19    04/2021   HIV (human immunodeficiency virus infection) (HCC) 03/2022   Primary hypertension 05/06/2023   Past Surgical History:  Procedure Laterality Date   FEMUR FRACTURE SURGERY     Family History  Problem Relation Age of Onset   Heart disease Mother    Hypertension Mother    Healthy Mother    Rheumatic fever Mother    Stroke Father    Hypertension Father    Hypertension Paternal Grandmother    Diabetes Paternal Grandmother    Asthma Neg Hx    Outpatient Medications Prior to Visit  Medication Sig Dispense Refill   acetaminophen (TYLENOL) 325 MG tablet Take 2 tablets (650 mg total) by mouth every 4 (four) hours as needed (for pain scale < 4). (Patient not taking: Reported on 05/06/2023)     aspirin EC 81 MG tablet Take 81 mg by mouth daily. Swallow whole. (Patient not taking: Reported on 05/06/2023)     Blood Pressure Monitoring (BLOOD PRESSURE KIT) DEVI 1 Device by Does not apply route once a week. 1 each 0   dolutegravir-lamiVUDine (DOVATO) 50-300 MG tablet Take 1 tablet by mouth daily. 30 tablet 11   ibuprofen (ADVIL) 600 MG tablet Take 1 tablet (600 mg total) by mouth every 6 (six) hours. (Patient not taking: Reported on 05/06/2023) 30 tablet 0   metroNIDAZOLE (METROGEL) 0.75 % vaginal gel Place  1 Applicatorful vaginally at bedtime. (Patient not taking: Reported on 07/03/2023) 70 g 0   NIFEdipine (ADALAT CC) 30 MG 24 hr tablet Take 1 tablet (30 mg total) by mouth daily. 90 tablet 1   Prenatal Vit-Fe Fumarate-FA (PRENATAL VITAMINS PO) Take 1 tablet by mouth daily. (Patient not taking: Reported on 05/28/2023)     valACYclovir (VALTREX) 500 MG tablet TAKE 1 TABLET BY MOUTH TWICE A DAY (Patient not taking: Reported on 07/03/2023) 60 tablet 11   norethindrone (CAMILA) 0.35 MG tablet Take 1 tablet (0.35 mg total) by mouth daily. 28 tablet 11   No  facility-administered medications prior to visit.   Allergies  Allergen Reactions   Amoxicillin Hives   Banana Itching and Swelling   Doxycycline Hives   Kiwi Extract Swelling   Nirmatrelvir Hives   Paxlovid [Nirmatrelvir-Ritonavir] Hives   Strawberry (Diagnostic) Swelling     ROS: A complete ROS was performed with pertinent positives/negatives noted in the HPI. The remainder of the ROS are negative.    Objective:   Today's Vitals   07/03/23 1540  BP: 104/70  Pulse: 79  Temp: 98.2 F (36.8 C)  TempSrc: Temporal  SpO2: 99%  Weight: 279 lb 9.6 oz (126.8 kg)  Height: 5\' 10"  (1.778 m)    GENERAL: Well-appearing, in NAD. Well nourished.  SKIN: Pink, warm and dry. No rash, lesion, ulceration, or ecchymoses.  NECK: Trachea midline. Full ROM w/o pain or tenderness. No lymphadenopathy.  RESPIRATORY: Chest wall symmetrical. Respirations even and non-labored. Breath sounds clear to auscultation bilaterally.  CARDIAC: S1, S2 present, regular rate and rhythm. Peripheral pulses 2+ bilaterally.  MSK: Muscle tone and strength appropriate for age. Joints w/o tenderness, redness, or swelling. EXTREMITIES: Without clubbing, cyanosis, or edema.  NEUROLOGIC: No motor or sensory deficits. Steady, even gait.  PSYCH/MENTAL STATUS: Alert, oriented x 3. Cooperative, appropriate mood and affect.    Results for orders placed or performed in visit on 07/03/23  POCT Pregnancy, Urine  Result Value Ref Range   Negative        Assessment & Plan:  1. Vaginal bleeding - POCT Pregnancy, Urine  2. Encounter for female birth control - norethindrone (CAMILA) 0.35 MG tablet; Take 1 tablet (0.35 mg total) by mouth daily.  Dispense: 84 tablet; Refill: 3 -Educated patient about the progesterone only contraceptive, and irregular bleeding is common.  I think her cycle was interfered with onset of taking birth control, with which the same week her cycle was supposed to start.  I educated patient to continue  her cycle this week, and do a Sunday start.  Educated that irregular bleeding can occur for the first 3 months on OCPs.  We can try ibuprofen 800 mg 3 times daily as needed if bleeding continues, but to hold off on this for now.   Meds ordered this encounter  Medications   norethindrone (CAMILA) 0.35 MG tablet    Sig: Take 1 tablet (0.35 mg total) by mouth daily.    Dispense:  84 tablet    Refill:  3    Brand name preferred    Order Specific Question:   Supervising Provider    Answer:   Garnette Gunner [[id number]]   Lab Orders  No laboratory test(s) ordered today   No images are attached to the encounter or orders placed in the encounter.  Return for Scheduled Routine Office Visits and as needed.   Of note, portions of this note may have been created with voice recognition software [employer]). While this note has been edited for accuracy, occasional wrong-word or 'sound-a-like' substitutions may have occurred due to the inherent limitations of voice recognition software.  Salvatore Decent, FNP

## 2023-09-09 ENCOUNTER — Ambulatory Visit: Payer: Medicaid Other | Admitting: Obstetrics and Gynecology

## 2023-09-13 ENCOUNTER — Other Ambulatory Visit: Payer: Self-pay

## 2023-09-13 ENCOUNTER — Telehealth: Payer: Medicaid Other | Admitting: Infectious Diseases

## 2023-09-23 ENCOUNTER — Ambulatory Visit (INDEPENDENT_AMBULATORY_CARE_PROVIDER_SITE_OTHER): Payer: Medicaid Other | Admitting: Student

## 2023-09-23 ENCOUNTER — Other Ambulatory Visit: Payer: Self-pay

## 2023-09-23 ENCOUNTER — Encounter: Payer: Self-pay | Admitting: Student

## 2023-09-23 VITALS — BP 129/85 | HR 83 | Ht 69.0 in | Wt 281.0 lb

## 2023-09-23 DIAGNOSIS — Z3009 Encounter for other general counseling and advice on contraception: Secondary | ICD-10-CM | POA: Diagnosis not present

## 2023-09-23 DIAGNOSIS — M6289 Other specified disorders of muscle: Secondary | ICD-10-CM

## 2023-09-23 DIAGNOSIS — Z3201 Encounter for pregnancy test, result positive: Secondary | ICD-10-CM | POA: Diagnosis not present

## 2023-09-23 DIAGNOSIS — M545 Low back pain, unspecified: Secondary | ICD-10-CM | POA: Diagnosis not present

## 2023-09-23 LAB — POCT PREGNANCY, URINE: Preg Test, Ur: NEGATIVE

## 2023-09-23 MED ORDER — LO LOESTRIN FE 1 MG-10 MCG / 10 MCG PO TABS
1.0000 | ORAL_TABLET | Freq: Every day | ORAL | 3 refills | Status: AC
Start: 2023-09-23 — End: ?

## 2023-09-23 NOTE — Progress Notes (Signed)
History:  [name] is a 22 y.o. G1P1001 who presents to clinic today for IUD placement. Cycles last 7 days with pain prior to onset. Starts out "heavy" and gradually gets lighter. Patient also c/o back pain that onset after childbirth.  The following portions of the patient's history were reviewed and updated as appropriate: allergies, current medications, family history, past medical history, social history, past surgical history and problem list.  Review of Systems:  Review of Systems  Constitutional:  Negative for fever, malaise/fatigue and weight loss.  Respiratory:  Negative for cough and shortness of breath.   Cardiovascular:  Negative for chest pain.  Gastrointestinal:  Negative for abdominal pain, nausea and vomiting.  Genitourinary:  Negative for dysuria and urgency.  Musculoskeletal:  Positive for back pain.  Neurological:  Positive for headaches. Negative for dizziness.  Psychiatric/Behavioral:  Negative for depression and suicidal ideas.        + mood swings      Objective:  Physical Exam BP 129/85   Pulse 83   Ht 5\' 9"  (1.753 m)   Wt 281 lb (127.5 kg)   LMP 09/16/2023   Breastfeeding No   BMI 41.50 kg/m  Physical Exam Constitutional:      Appearance: Normal appearance. She is obese.  Cardiovascular:     Rate and Rhythm: Normal rate.  Pulmonary:     Effort: Pulmonary effort is normal.  Genitourinary:    Comments: deferred Musculoskeletal:        General: Normal range of motion.  Neurological:     Mental Status: She is alert and oriented to person, place, and time. Mental status is at baseline.  Psychiatric:        Mood and Affect: Mood normal.        Behavior: Behavior normal.        Thought Content: Thought content normal.        Judgment: Judgment normal.       Labs and Imaging Results for orders placed or performed in visit on 09/23/23 (from the past 24 hour(s))  Pregnancy, urine POC     Status: None   Collection Time: 09/23/23   2:21 PM  Result Value Ref Range   Preg Test, Ur NEGATIVE NEGATIVE    No results found.  Health Maintenance Due  Topic Date Due   INFLUENZA VACCINE  06/27/2023    Labs, imaging and previous visits in Epic and Care Everywhere reviewed  Assessment & Plan:  1. General counseling and advice for contraceptive management - Upon discussing IUD procedure and side effects, patient does not desire to proceed at this time. Discussed options of hormonal vs. Non-hormonal and pre-medication.  - Reviewed all forms of birth control options available including abstinence; over the counter/barrier methods; hormonal contraceptive medication including pill, patch, ring, injection,contraceptive implant; hormonal and nonhormonal IUDs. Risks and benefits reviewed. Method of initiation discussed and options for pain management for IUD placement. Questions were answered.   - Patient elected for [facility] - LO LOESTRIN FE 1 MG-10 MCG / 10 MCG tablet; Take 1 tablet by mouth daily.  Dispense: 84 tablet; Refill: 3  2. Pelvic floor dysfunction in female 3. Bilateral low back pain without sciatica, unspecified chronicity - Likely related to strain of prior pregnancy - Ambulatory referral to Physical Therapy   Approximately 30 minutes of total time was spent with this patient on direct care and counseling; the remainder of the visit was spent reviewing chart and patient history  Return in about  3 months (around 12/24/2023) for VIRTUAL; OCP follow-up .  Corlis Hove, NP 09/23/2023 6:27 PM

## 2023-09-26 ENCOUNTER — Ambulatory Visit (INDEPENDENT_AMBULATORY_CARE_PROVIDER_SITE_OTHER): Payer: Medicaid Other | Admitting: Internal Medicine

## 2023-09-26 ENCOUNTER — Ambulatory Visit (INDEPENDENT_AMBULATORY_CARE_PROVIDER_SITE_OTHER): Payer: Medicaid Other

## 2023-09-26 ENCOUNTER — Encounter: Payer: Self-pay | Admitting: Internal Medicine

## 2023-09-26 VITALS — BP 122/70 | HR 89 | Temp 98.4°F | Ht 69.0 in | Wt 281.4 lb

## 2023-09-26 DIAGNOSIS — R0989 Other specified symptoms and signs involving the circulatory and respiratory systems: Secondary | ICD-10-CM | POA: Diagnosis not present

## 2023-09-26 DIAGNOSIS — R051 Acute cough: Secondary | ICD-10-CM

## 2023-09-26 MED ORDER — AZITHROMYCIN 250 MG PO TABS
ORAL_TABLET | ORAL | 0 refills | Status: AC
Start: 2023-09-26 — End: 2023-10-01

## 2023-09-26 NOTE — Progress Notes (Signed)
[facility] [address] [place] [address] Dept: [phone] Dept Fax: [phone]  Acute Care Office Visit  Subjective:   Pam Jones 2001/04/26 09/26/2023  Chief Complaint  Patient presents with   Nasal Congestion    Cough, sneezing  Started on Tuesday     HPI: Discussed the use of AI scribe software for clinical note transcription with the patient, who gave verbal consent to proceed.  History of Present Illness   The patient, with a history of allergies to amoxicillin and doxycycline, presents with acute cough and congestion onset 2 days ago. They initially attributed their symptoms to sinus issues related to weather changes. However, they noted that many of their coworkers were experiencing similar symptoms, including cough, diarrhea, and vomiting. The patient themselves had one episode of vomiting on Tuesday.  Their symptoms began on Tuesday afternoon with sinus congestion, headache, and runny nose. By Wednesday, they felt unwell and were unable to go to work. They deny having a fever. On Thursday, they developed a cough and noticed that they were easily winded, even when standing up. They also report body aches and a decreased appetite, finding the act of chewing food somewhat nauseating. They deny chest pain but report overall body aches and feeling winded when walking up an incline.  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 They express concern about potential exposure to walking pneumonia, as it has been common among their coworkers.   They have only taken Elderberry Tea for symptoms.        The following portions of the patient's history were reviewed and updated as appropriate: past medical history, past surgical history, family history, social history, allergies, medications, and problem list.   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   Supervision of high risk pregnancy, antepartum 07/05/2022   HIV (human immunodeficiency virus infection) (HCC)    Past Medical History:  Diagnosis Date   COVID-19    04/2021   HIV (human immunodeficiency virus infection) (HCC) 03/2022   Primary hypertension 05/06/2023   Past Surgical History:  Procedure Laterality Date   FEMUR FRACTURE SURGERY     Family History  Problem Relation Age of Onset   Heart disease Mother    Hypertension Mother    Healthy Mother    Rheumatic fever Mother    Stroke Father    Hypertension Father    Hypertension Paternal Grandmother    Diabetes Paternal Grandmother    Asthma Neg Hx     Current Outpatient Medications:    azithromycin (ZITHROMAX) 250 MG tablet, Take 2 tablets on day 1, then 1 tablet daily on days 2 through 5, Disp: 6 tablet, Rfl: 0   dolutegravir-lamiVUDine (DOVATO) 50-300 MG tablet, Take 1 tablet by mouth daily., Disp: 30 tablet, Rfl: 11   LO LOESTRIN FE 1 MG-10 MCG / 10 MCG tablet, Take 1 tablet by mouth daily., Disp: 84 tablet, Rfl: 3   NIFEdipine (ADALAT CC) 30 MG 24 hr tablet, Take 1 tablet (30 mg total) by mouth daily., Disp: 90 tablet, Rfl: 1 Allergies  Allergen Reactions   Amoxicillin Hives   Banana Itching and Swelling   Doxycycline Hives   Kiwi Extract Swelling  Nirmatrelvir Hives   Paxlovid [Nirmatrelvir-Ritonavir] Hives   Strawberry (Diagnostic) Swelling     ROS: A complete ROS was performed with pertinent positives/negatives noted in the HPI. The remainder of the ROS are negative.    Objective:   Today's Vitals   09/26/23 1425  BP: 122/70  Pulse: 89  Temp: 98.4 F (36.9 C)  TempSrc: Temporal  SpO2: 98%  Weight: 281 lb 6.4 oz (127.6 kg)  Height: 5\' 9"  (1.753 m)    GENERAL:  Well-appearing, in NAD. Well nourished.  SKIN: Pink, warm and dry. No rash, lesion, ulceration, or ecchymoses.  HEENT:    HEAD: Normocephalic, non-traumatic.  EYES: Conjunctive pink without exudate. PERRL, EOMI.  EARS: External ear w/o redness, swelling, masses, or lesions. EAC clear. TM's intact, translucent w/o bulging, appropriate landmarks visualized.  NOSE: Septum midline w/o deformity. Nares patent, mucosa pink and non-inflamed with drainage.  THROAT: Uvula midline. Oropharynx clear. Tonsils non-inflamed w/o exudate. Mucus membranes pink and moist.  NECK: Trachea midline. Full ROM w/o pain or tenderness. No lymphadenopathy.  RESPIRATORY: Chest wall symmetrical. Respirations even and non-labored. Breath sounds clear to auscultation bilaterally. (+)cough CARDIAC: S1, S2 present, regular rate and rhythm. Peripheral pulses 2+ bilaterally.  EXTREMITIES: Without clubbing, cyanosis, or edema.  NEUROLOGIC: Steady, even gait.  PSYCH/MENTAL STATUS: Alert, oriented x 3. Cooperative, appropriate mood and affect.    Results for orders placed or performed in visit on 09/26/23  POCT Influenza A/B  Result Value Ref Range   Influenza A, POC Negative Negative   Influenza B, POC Negative Negative  POC COVID-19 BinaxNow  Result Value Ref Range   SARS Coronavirus 2 Ag Negative Negative      Assessment & Plan:  Assessment and Plan    Acute Cough/URI Symptoms of sinus congestion, headache, runny nose, body aches, and cough started on Tuesday. One episode of vomiting. No fever. Exposure to similar illness at work. No chest pain, but reports getting easily winded with exertion. Lungs clear on auscultation. COVID and flu tests negative. -Order chest x-ray to rule out pneumonia. -Prescribe Azithromycin (Z-Pak) - OTC meds discussed and provided on d/c summary for congestion and cough     Acute cough -     POCT Influenza A/B -     POC COVID-19 BinaxNow -     DG Chest 2 View; Future -      Azithromycin; Take 2 tablets on day 1, then 1 tablet daily on days 2 through 5  Dispense: 6 tablet; Refill: 0   Meds ordered this encounter  Medications   azithromycin (ZITHROMAX) 250 MG tablet    Sig: Take 2 tablets on day 1, then 1 tablet daily on days 2 through 5    Dispense:  6 tablet    Refill:  0    Order Specific Question:   Supervising Provider    Answer:   Garnette Gunner [[id number]]   Orders Placed This Encounter  Procedures   DG Chest 2 View    Standing Status:   Future    Number of Occurrences:   1    Standing Expiration Date:   03/25/2024    Order Specific Question:   Reason for Exam (SYMPTOM  OR DIAGNOSIS REQUIRED)    Answer:   acute cough and congestion , rule out pneumonia    Order Specific Question:   Is the patient pregnant?    Answer:   No    Order Specific Question:   Preferred imaging location?    Answer:  Internal   POCT Influenza A/B   POC COVID-19 BinaxNow    Order Specific Question:   Previously tested for COVID-19    Answer:   No    Order Specific Question:   Resident in a congregate (group) care setting    Answer:   No    Order Specific Question:   Employed in healthcare setting    Answer:   Unknown    Order Specific Question:   Pregnant    Answer:   No   Lab Orders         POCT Influenza A/B         POC COVID-19 BinaxNow    No images are attached to the encounter or orders placed in the encounter.  Return if symptoms worsen or fail to improve.   Salvatore Decent, FNP

## 2023-09-26 NOTE — Patient Instructions (Addendum)
Go for chest xray     Rest, drink plenty of fluids.  Tylenol for fever, body aches.   For cough: Take Mucinex DM or Robitussin-DM over-the-counter.  Follow the instructions in the box.  For nasal and sinus congestion: Use over-the-counter Flonase: 2 nasal sprays on each side of the nose in the morning until you feel better  Use over-the-counter Astepro 2 nasal sprays on each side of the nose twice daily until better  Patients with high blood pressure can use Coricidin HBP for decongestant, it will not raise your blood pressure.   If you have been prescribed an antibiotic, take as prescribed.  Call if not gradually better over the next  10 days  Call anytime if the symptoms are severe, you have high fever, short of breath, chest pain

## 2023-09-27 LAB — POC COVID19 BINAXNOW: SARS Coronavirus 2 Ag: NEGATIVE

## 2023-09-27 LAB — POCT INFLUENZA A/B
Influenza A, POC: NEGATIVE
Influenza B, POC: NEGATIVE

## 2023-10-30 DIAGNOSIS — H16143 Punctate keratitis, bilateral: Secondary | ICD-10-CM | POA: Diagnosis not present

## 2023-11-04 ENCOUNTER — Ambulatory Visit: Payer: Self-pay | Admitting: Infectious Diseases

## 2023-11-04 ENCOUNTER — Other Ambulatory Visit: Payer: Self-pay | Admitting: Internal Medicine

## 2023-11-04 DIAGNOSIS — I10 Essential (primary) hypertension: Secondary | ICD-10-CM

## 2023-11-12 ENCOUNTER — Encounter: Payer: Self-pay | Admitting: Infectious Diseases

## 2023-11-12 ENCOUNTER — Other Ambulatory Visit: Payer: Self-pay

## 2023-11-12 ENCOUNTER — Ambulatory Visit: Payer: Medicaid Other | Admitting: Infectious Diseases

## 2023-11-12 ENCOUNTER — Other Ambulatory Visit: Payer: Self-pay | Admitting: Pharmacist

## 2023-11-12 VITALS — BP 116/75 | HR 87 | Temp 98.2°F | Ht 70.0 in | Wt 270.0 lb

## 2023-11-12 DIAGNOSIS — Z21 Asymptomatic human immunodeficiency virus [HIV] infection status: Secondary | ICD-10-CM | POA: Diagnosis not present

## 2023-11-12 DIAGNOSIS — B20 Human immunodeficiency virus [HIV] disease: Secondary | ICD-10-CM

## 2023-11-12 MED ORDER — DOLUTEGRAVIR-LAMIVUDINE 50-300 MG PO TABS
1.0000 | ORAL_TABLET | Freq: Every day | ORAL | 11 refills | Status: DC
Start: 2023-11-12 — End: 2024-06-23
  Filled 2023-11-12 – 2023-11-29 (×3): qty 30, 30d supply, fill #0
  Filled 2023-12-25: qty 30, 30d supply, fill #1
  Filled 2024-01-20: qty 30, 30d supply, fill #2
  Filled 2024-02-18: qty 30, 30d supply, fill #3
  Filled 2024-03-07 – 2024-03-20 (×2): qty 30, 30d supply, fill #4
  Filled 2024-04-10: qty 30, 30d supply, fill #5
  Filled 2024-05-07: qty 30, 30d supply, fill #6
  Filled 2024-06-17: qty 30, 30d supply, fill #7

## 2023-11-12 NOTE — Progress Notes (Signed)
Specialty Pharmacy Initiation Note   Pam Jones is a 22 y.o. female who will be followed by the specialty pharmacy service for RxSp HIV    Review of administration, indication, effectiveness, safety, potential side effects, storage/disposable, and missed dose instructions occurred today for patient's specialty medication(s) Dolutegravir-lamiVUDine (DOVATO)     Patient/Caregiver did not have any additional questions or concerns.   Patient's therapy is appropriate to: Initiate    Goals Addressed             This Visit's Progress    Achieve Undetectable HIV Viral Load < 20       Patient is on track. Patient will maintain adherence.      Comply with lab assessments       Patient is on track. Patient will adhere to provider and/or lab appointments.      Improve or maintain quality of life       Patient is on track. Patient will be monitored by provider to determine if a change in treatment plan is warranted.         Seward Coran L. Maki Hege, PharmD, BCIDP, AAHIVP, CPP Clinical Pharmacist Practitioner Infectious Diseases Clinical Pharmacist [facility] 11/12/2023, 3:20 PM

## 2023-11-12 NOTE — Progress Notes (Signed)
Name: Pam Jones  DOB: [birth year] MRN: [id number] PCP: Salvatore Decent, FNP    Brief Narrative:  Pam Jones is a 22 y.o. female with HIV diagnosed in May of 2023.  CD4 nadir 350 HIV Risk: sexual History of OIs: none Intake Labs: Hep B sAg (-), sAb (+), cAb (-); Hep A (-), Hep C (-) Quantiferon (-) HLA B*5701 () G6PD: ()   Previous Regimens: Tivicay + Descovy (during pregnancy 2024)  Dovato   Genotypes: 2023 - VL too small to detect    Subjective:   Chief Complaint  Patient presents with   Follow-up    Discussed the use of AI scribe software for clinical note transcription with the patient, who gave verbal consent to proceed.  History of Present Illness   Bertice presents for a routine follow-up visit. She reports no new health concerns and is continues Dovato once daily for HIV management. She has had some trouble with the pharmacy and prefers to have medications mailed to her.   The patient also mentions a recent STI check at a free clinic due to concerns about her son's father's fidelity. She underwent comprehensive screening, including urine, oral, and vaginal swabs, all of which returned negative results. However, she did not receive a blood test for syphilis, which she is open to having done today.  In terms of contraception, the patient is currently on Loestrin to prevent pregnancy. She is not considering having more children at this time. Her last Pap smear was in May, which showed low-grade changes. She is due for a repeat Pap smear in May of 2025.         11/12/2023    2:06 PM  Depression screen PHQ 2/9  Decreased Interest 0  Down, Depressed, Hopeless 0  PHQ - 2 Score 0    Review of Systems  Constitutional:  Negative for chills, fever, malaise/fatigue and weight loss.  HENT:  Negative for sore throat.        No dental problems  Respiratory:  Negative for cough and sputum production.   Cardiovascular:  Negative for chest pain and leg  swelling.  Gastrointestinal:  Negative for abdominal pain, diarrhea and vomiting.  Genitourinary:  Negative for dysuria and flank pain.  Musculoskeletal:  Negative for joint pain, myalgias and neck pain.  Skin:  Negative for rash.  Neurological:  Negative for dizziness, tingling and headaches.  Psychiatric/Behavioral:  Negative for depression and substance abuse. The patient is not nervous/anxious and does not have insomnia.     Past Medical History:  Diagnosis Date   COVID-19    04/2021   HIV (human immunodeficiency virus infection) (HCC) 03/2022   Primary hypertension 05/06/2023    Outpatient Medications Prior to Visit  Medication Sig Dispense Refill   LO LOESTRIN FE 1 MG-10 MCG / 10 MCG tablet Take 1 tablet by mouth daily. 84 tablet 3   NIFEdipine (ADALAT CC) 30 MG 24 hr tablet TAKE 1 TABLET BY MOUTH EVERY DAY 90 tablet 1   dolutegravir-lamiVUDine (DOVATO) 50-300 MG tablet Take 1 tablet by mouth daily. 30 tablet 11   No facility-administered medications prior to visit.     Allergies  Allergen Reactions   Amoxicillin Hives   Banana Itching and Swelling   Doxycycline Hives   Kiwi Extract Swelling   Nirmatrelvir Hives   Paxlovid [Nirmatrelvir-Ritonavir] Hives   Strawberry (Diagnostic) Swelling    Social History   Tobacco Use   Smoking status: Never    Passive exposure: Yes  Smokeless tobacco: Never  Vaping Use   Vaping status: Never Used  Substance Use Topics   Alcohol use: Yes    Comment: occ   Drug use: Not Currently    Frequency: 7.0 times per week    Types: Marijuana    Comment: Daily    Social History   Substance and Sexual Activity  Sexual Activity Not Currently   Birth control/protection: None     Objective:   Vitals:   11/12/23 1401  BP: 116/75  Pulse: 87  Temp: 98.2 F (36.8 C)  TempSrc: Temporal  SpO2: 100%  Weight: 270 lb (122.5 kg)  Height: 5\' 10"  (1.778 m)   Body mass index is 38.74 kg/m.   Physical Exam Constitutional:       Appearance: Normal appearance. She is not ill-appearing.  HENT:     Mouth/Throat:     Mouth: Mucous membranes are moist.     Pharynx: Oropharynx is clear.  Eyes:     General: No scleral icterus. Cardiovascular:     Rate and Rhythm: Normal rate and regular rhythm.  Pulmonary:     Effort: Pulmonary effort is normal.  Neurological:     Mental Status: She is oriented to person, place, and time.  Psychiatric:        Mood and Affect: Mood normal.        Thought Content: Thought content normal.     Lab Results Lab Results  Component Value Date   WBC 8.6 03/18/2023   HGB 12.5 03/18/2023   HCT 38.4 03/18/2023   MCV 83.5 03/18/2023   PLT 240 03/18/2023    Lab Results  Component Value Date   CREATININE 0.98 03/18/2023   BUN 10 03/18/2023   NA 137 03/18/2023   K 3.4 (L) 03/18/2023   CL 105 03/18/2023   CO2 22 03/18/2023    Lab Results  Component Value Date   ALT 31 03/18/2023   AST 25 03/18/2023   ALKPHOS 90 03/18/2023   BILITOT 0.6 03/18/2023    Lab Results  Component Value Date   CHOL 143 06/25/2012   HDL 51 06/25/2012   LDLCALC 79 06/25/2012   TRIG 67 06/25/2012   CHOLHDL 2.8 06/25/2012   HIV 1 RNA Quant  Date Value  05/28/2023 <20 Copies/mL (H)  02/14/2023 40 copies/mL  01/02/2023 31 Copies/mL (H)   CD4 T Cell Abs (/uL)  Date Value  05/28/2023 484  11/01/2022 367 (L)     Assessment & Plan:     HIV -  Stable on antiretroviral therapy with once daily Dovato. Discussed potential future participation in a research study for a once-weekly combination therapy - will let her know when we enroll.  -Continue Dovato -Check viral load and T cell count today. -Consider patient for future research study on once-weekly combination therapy. -On OCPs for family planning / contraception  -Pap due May 2025  Sexual Health Screening -  Recent STI check at a free clinic due to partner infidelity. All tests negative. -Perform syphilis screen today for completeness.    Contraception Currently on Loestrin for birth control. -separate from dovato by 6 hours to avoid blunted absorption of dovato  Pharmacy Change Patient wishes to change pharmacy to [facility]. -Transfer prescription to [facility].      Meds ordered this encounter  Medications   dolutegravir-lamiVUDine (DOVATO) 50-300 MG tablet    Sig: Take 1 tablet by mouth daily.    Dispense:  30 tablet    Refill:  11    Patient would like this mailed to her please - she is expecting your call.   Orders Placed This Encounter  Procedures   RPR   HIV 1 RNA quant-no reflex-bld   T-helper cells (CD4) count   Return in about 5 months (around 04/11/2024).   Rexene Alberts, MSN, NP-C [facility] Infectious Disease [facility] Pager: [phone] Office: [phone]  11/12/23  3:07 PM

## 2023-11-12 NOTE — Patient Instructions (Addendum)
Make sure you separate the birth control pill from the Dovato by 6 hours if you can. The iron that's in the birth control can decrease how well the dovato absorbs if they are in the stomach the same time.   Will see you back May - we can do your pap smear that same day

## 2023-11-12 NOTE — Progress Notes (Signed)
Specialty Pharmacy Initial Fill Coordination Note  Pam Jones is a 22 y.o. female contacted today regarding initial fill of specialty medication(s) Dolutegravir-lamiVUDine (DOVATO)   Patient requested Delivery   Delivery date: 11/14/23   Verified address: [name] [address]   Medication will be filled on 11/13/23.   Patient is aware of $0 copayment.

## 2023-11-13 ENCOUNTER — Other Ambulatory Visit: Payer: Self-pay

## 2023-11-13 ENCOUNTER — Other Ambulatory Visit (HOSPITAL_COMMUNITY): Payer: Self-pay

## 2023-11-13 LAB — T-HELPER CELLS (CD4) COUNT (NOT AT ARMC)
CD4 % Helper T Cell: 45 % (ref 33–65)
CD4 T Cell Abs: 711 /uL (ref 400–1790)

## 2023-11-14 ENCOUNTER — Other Ambulatory Visit (HOSPITAL_COMMUNITY): Payer: Self-pay

## 2023-11-14 LAB — HIV-1 RNA QUANT-NO REFLEX-BLD
HIV 1 RNA Quant: <20 {copies}/mL — ABNORMAL HIGH
HIV-1 RNA Quant, Log: <1.3 {Log} — ABNORMAL HIGH

## 2023-11-14 LAB — RPR: RPR Ser Ql: NONREACTIVE

## 2023-11-14 NOTE — Progress Notes (Signed)
Placing rx on hold. Ins reject refill too soon last filled & picked up from [facility] [address] on 11/11/23

## 2023-11-14 NOTE — Progress Notes (Signed)
Pending Script   Spoke to patient to set up initial fill medication as I was told they were waiting for my call to set up. Asked patient if they needed the medication and was told yes. Another Pharmacy has filled the medication. Called patient to ask what was going on as far as if she received the medication I received no answer and proceeded to leave Voice Mail. At this moment [facility] could not process due to refill too soon,waiting for patient to give [place] a call back.

## 2023-11-29 ENCOUNTER — Other Ambulatory Visit: Payer: Self-pay

## 2023-11-29 NOTE — Progress Notes (Signed)
 Encounter created by mistake

## 2023-12-03 ENCOUNTER — Other Ambulatory Visit (HOSPITAL_COMMUNITY): Payer: Self-pay

## 2023-12-19 ENCOUNTER — Other Ambulatory Visit: Payer: Self-pay

## 2023-12-23 ENCOUNTER — Other Ambulatory Visit (HOSPITAL_COMMUNITY): Payer: Self-pay

## 2023-12-23 ENCOUNTER — Encounter (HOSPITAL_COMMUNITY): Payer: Self-pay

## 2023-12-25 ENCOUNTER — Other Ambulatory Visit (HOSPITAL_COMMUNITY): Payer: Self-pay | Admitting: Pharmacy Technician

## 2023-12-25 ENCOUNTER — Other Ambulatory Visit (HOSPITAL_COMMUNITY): Payer: Self-pay

## 2023-12-25 NOTE — Progress Notes (Signed)
Specialty Pharmacy Refill Coordination Note  Pam Jones is a 23 y.o. female contacted today regarding refills of specialty medication(s) Dolutegravir-lamiVUDine (DOVATO)   Patient requested Delivery   Delivery date: 12/31/23   Verified address: Patient address [address]  [place]   Medication will be filled on 12/30/23.

## 2023-12-26 ENCOUNTER — Encounter: Payer: Self-pay | Admitting: Internal Medicine

## 2024-01-20 ENCOUNTER — Other Ambulatory Visit: Payer: Self-pay

## 2024-01-20 NOTE — Progress Notes (Signed)
 Specialty Pharmacy Refill Coordination Note  Pam Jones is a 23 y.o. female contacted today regarding refills of specialty medication(s) Dolutegravir-lamiVUDine (DOVATO)   Patient requested Delivery   Delivery date: 01/27/24   Verified address: Patient address [address]  [place]   Medication will be filled on 01/24/24.

## 2024-01-24 ENCOUNTER — Other Ambulatory Visit: Payer: Self-pay

## 2024-02-18 ENCOUNTER — Other Ambulatory Visit: Payer: Self-pay

## 2024-02-18 ENCOUNTER — Other Ambulatory Visit (HOSPITAL_COMMUNITY): Payer: Self-pay

## 2024-02-18 NOTE — Progress Notes (Signed)
 Specialty Pharmacy Refill Coordination Note  Pam Jones is a 23 y.o. female contacted today regarding refills of specialty medication(s) Dolutegravir-lamiVUDine (DOVATO)   Patient requested (Patient-Rptd) Delivery   Delivery date: 02/20/24   Verified address: (Patient-Rptd) [address] [place] [address]   Medication will be filled on 02/19/24.

## 2024-03-09 ENCOUNTER — Other Ambulatory Visit: Payer: Self-pay

## 2024-03-17 ENCOUNTER — Other Ambulatory Visit (HOSPITAL_COMMUNITY): Payer: Self-pay

## 2024-03-20 ENCOUNTER — Other Ambulatory Visit: Payer: Self-pay

## 2024-03-20 ENCOUNTER — Other Ambulatory Visit (HOSPITAL_COMMUNITY): Payer: Self-pay

## 2024-03-20 NOTE — Progress Notes (Signed)
 Specialty Pharmacy Refill Coordination Note  Pam Jones is a 23 y.o. female contacted today regarding refills of specialty medication(s) Dolutegravir -lamiVUDine  (DOVATO )   Patient requested Delivery   Delivery date: 03/23/24   Verified address: [address]   [place] [address]   Medication will be filled on 03/20/24.

## 2024-03-25 ENCOUNTER — Other Ambulatory Visit: Payer: Self-pay | Admitting: Internal Medicine

## 2024-03-25 ENCOUNTER — Telehealth: Payer: Self-pay | Admitting: Internal Medicine

## 2024-03-25 DIAGNOSIS — Z111 Encounter for screening for respiratory tuberculosis: Secondary | ICD-10-CM

## 2024-03-25 NOTE — Telephone Encounter (Signed)
 Yes that is okay to schedule TB skin test

## 2024-03-25 NOTE — Telephone Encounter (Signed)
 Copied from CRM [id number]. Topic: General - Other >> Mar 25, 2024 11:02 AM Jenice Mitts wrote: Reason for CRM: Pstient is calling in because she needs a copy of her results showing negative for TB and tetanus

## 2024-03-25 NOTE — Telephone Encounter (Signed)
 Patient would like TB skin test, asking for a return call when orders have been placed.

## 2024-03-26 ENCOUNTER — Ambulatory Visit

## 2024-03-27 ENCOUNTER — Telehealth: Payer: Self-pay

## 2024-03-27 ENCOUNTER — Ambulatory Visit (INDEPENDENT_AMBULATORY_CARE_PROVIDER_SITE_OTHER)

## 2024-03-27 VITALS — Temp 97.1°F

## 2024-03-27 DIAGNOSIS — Z111 Encounter for screening for respiratory tuberculosis: Secondary | ICD-10-CM

## 2024-03-27 NOTE — Progress Notes (Unsigned)
 Per orders of Pam Jones, injection of  PPD  given by [name] in right forearm. Patient tolerated injection well. Patient will make appointment for reading of placement for Monday 03/30/24.

## 2024-03-27 NOTE — Telephone Encounter (Signed)
 Patient was seen   Copied from CRM [id number]. Topic: General - Other >> Mar 27, 2024  9:52 AM Dorisann Garre T wrote: Reason for CRM: patient is needing a call back regarding her appt for a tb skin test she wanted to know if she could come in earlier

## 2024-03-30 ENCOUNTER — Ambulatory Visit (INDEPENDENT_AMBULATORY_CARE_PROVIDER_SITE_OTHER)

## 2024-03-30 ENCOUNTER — Ambulatory Visit: Payer: Medicaid Other | Admitting: Infectious Diseases

## 2024-03-30 DIAGNOSIS — Z111 Encounter for screening for respiratory tuberculosis: Secondary | ICD-10-CM

## 2024-03-30 NOTE — Progress Notes (Signed)
 PPD Reading Note  PPD read and results entered in EpicCare.  Result: 0 mm induration.  Interpretation: negative  If test not read within 48-72 hours of initial placement, patient advised to repeat in other arm 1-3 weeks after this test.  Allergic reaction: no

## 2024-04-01 ENCOUNTER — Ambulatory Visit

## 2024-04-10 ENCOUNTER — Other Ambulatory Visit: Payer: Self-pay

## 2024-04-10 NOTE — Progress Notes (Signed)
 Specialty Pharmacy Refill Coordination Note  Pam Jones is a 23 y.o. female contacted today regarding refills of specialty medication(s) Dolutegravir -lamiVUDine  (DOVATO )   Patient requested Delivery   Delivery date: (Patient-Rptd) 04/13/24   Verified address: (Patient-Rptd) [address] [place] [address]   Medication will be filled on 05.19.25 when the patient's insurance will pay.

## 2024-04-13 ENCOUNTER — Other Ambulatory Visit (HOSPITAL_COMMUNITY): Payer: Self-pay

## 2024-04-21 ENCOUNTER — Ambulatory Visit: Admitting: Infectious Diseases

## 2024-05-07 ENCOUNTER — Other Ambulatory Visit: Payer: Self-pay

## 2024-05-07 ENCOUNTER — Other Ambulatory Visit (HOSPITAL_COMMUNITY): Payer: Self-pay

## 2024-05-07 NOTE — Progress Notes (Signed)
 Specialty Pharmacy Refill Coordination Note  Spoke with Myrtle Atta (Self).   BRITIANY SILBERNAGEL is a 23 y.o. female contacted today regarding refills of specialty medication(s) Dolutegravir -lamiVUDine  (DOVATO )  Patient requested Delivery   Delivery date: 05/15/24   Verified address: [address]  [place] [address]  Medication will be filled on 05/14/24.

## 2024-05-07 NOTE — Progress Notes (Signed)
 Specialty Pharmacy Ongoing Clinical Assessment Note  SHERRILYNN GUDGEL is a 23 y.o. female who is being followed by the specialty pharmacy service for RxSp HIV   Patient's specialty medication(s) reviewed today: Dolutegravir -lamiVUDine  (DOVATO )   Missed doses in the last 4 weeks: 0   Patient/Caregiver did not have any additional questions or concerns.   Therapeutic benefit summary: Patient is achieving benefit   Adverse events/side effects summary: No adverse events/side effects   Patient's therapy is appropriate to: Continue    Goals Addressed             This Visit's Progress    Achieve Undetectable HIV Viral Load < 20   On track    Patient is on track. Patient will maintain adherence. Viral load as of 11/12/23 was <20 copies/mL.      Comply with lab assessments   On track    Patient is on track. Patient will adhere to provider and/or lab appointments.      Improve or maintain quality of life   On track    Patient is on track. Patient will be monitored by provider to determine if a change in treatment plan is warranted.         Follow up: 6 months  [facility]

## 2024-05-12 ENCOUNTER — Other Ambulatory Visit: Payer: Self-pay

## 2024-05-12 ENCOUNTER — Ambulatory Visit

## 2024-05-14 ENCOUNTER — Other Ambulatory Visit: Payer: Self-pay

## 2024-05-25 ENCOUNTER — Ambulatory Visit: Admitting: Infectious Diseases

## 2024-06-03 ENCOUNTER — Emergency Department (HOSPITAL_COMMUNITY)

## 2024-06-03 ENCOUNTER — Ambulatory Visit: Payer: Self-pay

## 2024-06-03 ENCOUNTER — Encounter (HOSPITAL_COMMUNITY): Payer: Self-pay

## 2024-06-03 ENCOUNTER — Emergency Department (HOSPITAL_COMMUNITY)
Admission: EM | Admit: 2024-06-03 | Discharge: 2024-06-03 | Disposition: A | Discharge status: Home / Self Care | Attending: Emergency Medicine | Admitting: Emergency Medicine

## 2024-06-03 DIAGNOSIS — R197 Diarrhea, unspecified: Secondary | ICD-10-CM | POA: Diagnosis not present

## 2024-06-03 DIAGNOSIS — N939 Abnormal uterine and vaginal bleeding, unspecified: Secondary | ICD-10-CM | POA: Insufficient documentation

## 2024-06-03 DIAGNOSIS — R1032 Left lower quadrant pain: Secondary | ICD-10-CM

## 2024-06-03 DIAGNOSIS — R112 Nausea with vomiting, unspecified: Secondary | ICD-10-CM

## 2024-06-03 LAB — CBC
HCT: 38.8 % (ref 36.0–46.0)
Hemoglobin: 12.2 g/dL (ref 12.0–15.0)
MCH: 27 pg (ref 26.0–34.0)
MCHC: 31.4 g/dL (ref 30.0–36.0)
MCV: 85.8 fL (ref 80.0–100.0)
Platelets: 230 K/uL (ref 150–400)
RBC: 4.52 MIL/uL (ref 3.87–5.11)
RDW: 13.8 % (ref 11.5–15.5)
WBC: 4.8 K/uL (ref 4.0–10.5)
nRBC: 0 % (ref 0.0–0.2)

## 2024-06-03 LAB — COMPREHENSIVE METABOLIC PANEL WITH GFR
ALT: 27 U/L (ref 0–44)
AST: 21 U/L (ref 15–41)
Albumin: 4 g/dL (ref 3.5–5.0)
Alkaline Phosphatase: 56 U/L (ref 38–126)
Anion gap: 8 (ref 5–15)
BUN: 10 mg/dL (ref 6–20)
CO2: 23 mmol/L (ref 22–32)
Calcium: 9.1 mg/dL (ref 8.9–10.3)
Chloride: 106 mmol/L (ref 98–111)
Creatinine, Ser: 1.11 mg/dL — ABNORMAL HIGH (ref 0.44–1.00)
GFR, Estimated: >60 mL/min (ref >60)
Glucose, Bld: 98 mg/dL (ref 70–99)
Potassium: 3.7 mmol/L (ref 3.5–5.1)
Sodium: 137 mmol/L (ref 135–145)
Total Bilirubin: 0.7 mg/dL (ref 0.0–1.2)
Total Protein: 7.3 g/dL (ref 6.5–8.1)

## 2024-06-03 LAB — HCG, SERUM, QUALITATIVE: Preg, Serum: NEGATIVE

## 2024-06-03 LAB — URINALYSIS, ROUTINE W REFLEX MICROSCOPIC
Bacteria, UA: NONE SEEN
Bilirubin Urine: NEGATIVE
Glucose, UA: NEGATIVE mg/dL
Ketones, ur: 5 mg/dL — AB
Leukocytes,Ua: NEGATIVE
Nitrite: NEGATIVE
Protein, ur: NEGATIVE mg/dL
Specific Gravity, Urine: 1.011 (ref 1.005–1.030)
pH: 6 (ref 5.0–8.0)

## 2024-06-03 LAB — LIPASE, BLOOD: Lipase: 26 U/L (ref 11–51)

## 2024-06-03 MED ORDER — LACTATED RINGERS IV BOLUS
1000.0000 mL | Freq: Once | INTRAVENOUS | Status: AC
  Administered 2024-06-03: 1000 mL via INTRAVENOUS

## 2024-06-03 MED ORDER — DICYCLOMINE HCL 20 MG PO TABS: 20.0000 mg | ORAL_TABLET | Freq: Two times a day (BID) | ORAL | 0 refills | Status: AC

## 2024-06-03 MED ORDER — ONDANSETRON 4 MG PO TBDP: 4.0000 mg | ORAL_TABLET | Freq: Three times a day (TID) | ORAL | 0 refills | Status: DC | PRN

## 2024-06-03 MED ORDER — IOHEXOL 300 MG/ML  SOLN
100.0000 mL | Freq: Once | INTRAMUSCULAR | Status: AC | PRN
  Administered 2024-06-03: 100 mL via INTRAVENOUS

## 2024-06-03 NOTE — ED Triage Notes (Signed)
 Pt arrived via EMS, from work. C/o n/v diarrhea and abd pain, upper left quad radiating to midline intermittently. States started with vaginal bleeding today. LMP ended June 30th, mentral cycles normally regular.

## 2024-06-03 NOTE — Telephone Encounter (Signed)
  Pt calling with severe vaginal bleeding that just started. Pt was in bathroom and on toilet and stated the blood is pouring out. Pt has had back pain, nausea/vomiting, and butterfly sensation for last several weeks. Pt stated her period ended on 6/30. Pt stated it was anormal period. 911 was dispatched. RN stayed with pt until EMS arrived.       Copied from CRM [id number]. Topic: Clinical - Red Word Triage >> Jun 03, 2024 10:00 AM Franky GRADE wrote: Red Word that prompted transfer to Nurse Triage: Patient is experiencing stomach cramps/pain, nausea, vomiting and diarrhea. She went to the rest room and noticed her underwear was full of blood. Reason for Disposition  Sounds like a life-threatening emergency to the triager  Protocols used: Vaginal Bleeding - Abnormal-A-AH

## 2024-06-03 NOTE — Telephone Encounter (Signed)
 Provider aware

## 2024-06-03 NOTE — ED Provider Notes (Signed)
 [place] EMERGENCY DEPARTMENT AT [facility] Provider Note   CSN: [id number] Arrival date & time: 06/03/24  1104     Patient presents with: Nausea and Abdominal Pain   Pam Jones is a 23 y.o. female.  {Add pertinent medical, surgical, social history, OB history to HPI:32947} HPI     Abdominal pain, back pain  Yesterday at work had vomiting and diarrhea, went home then wetn back Vaginal bleeding not due until the 30th for menses, normally menses regular. Using papertowels right now, bleeding amount like the 3rd day of her menses. No vaginal discharge No urinary symptoms.  Vomited for 3 days, like 1-2 times a day. Diarrhea comes and goes, gets looser throughout the day. Was constipated for a week and this going on for about 2 weeks.  5-6 times/day.  No black or bloody stools No fevers   Left sided abdominal pain and up towards left side, left side of back Has been going on for about 2 weeks.  Initially thought it was the msenses but then continued.  No new meds or changes. Adherent to meds.   Sexually active, not using protection right now, no concerns.  Past Medical History:  Diagnosis Date   COVID-19    04/2021   HIV (human immunodeficiency virus infection) (HCC) 03/2022   Primary hypertension 05/06/2023     Prior to Admission medications   Medication Sig Start Date End Date Taking? Authorizing Provider  dolutegravir -lamiVUDine  (DOVATO ) 50-300 MG tablet Take 1 tablet by mouth daily. 11/12/23   Melvenia Corean SAILOR, NP  LO LOESTRIN FE  1 MG-10 MCG / 10 MCG tablet Take 1 tablet by mouth daily. 09/23/23   Forlan, Nicole, NP  NIFEdipine  (ADALAT  CC) 30 MG 24 hr tablet TAKE 1 TABLET BY MOUTH EVERY DAY 11/04/23   Billy Knee, FNP    Allergies: Amoxicillin, Banana, Doxycycline, Kiwi extract, Nirmatrelvir, Paxlovid [nirmatrelvir-ritonavir], and Strawberry (diagnostic)    Review of Systems  Updated Vital Signs BP (!) 170/100   Pulse (!) 59   Temp 98.4 F  (36.9 C) (Oral)   Resp 17   LMP 05/19/2024   SpO2 100%   Physical Exam  (all labs ordered are listed, but only abnormal results are displayed) Labs Reviewed  COMPREHENSIVE METABOLIC PANEL WITH GFR - Abnormal; Notable for the following components:      Result Value   Creatinine, Ser 1.11 (*)    All other components within normal limits  URINALYSIS, ROUTINE W REFLEX MICROSCOPIC - Abnormal; Notable for the following components:   Hgb urine dipstick LARGE (*)    Ketones, ur 5 (*)    All other components within normal limits  LIPASE, BLOOD  CBC  HCG, SERUM, QUALITATIVE    EKG: None  Radiology: No results found.  {Document cardiac monitor, telemetry assessment procedure when appropriate:32947} Procedures   Medications Ordered in the ED  lactated ringers  bolus 1,000 mL (1,000 mLs Intravenous New Bag/Given 06/03/24 1201)      {Click here for ABCD2, HEART and other calculators REFRESH Note before signing:1}                              Medical Decision Making Amount and/or Complexity of Data Reviewed Labs: ordered.   ***  {Document critical care time when appropriate  Document review of labs and clinical decision tools ie CHADS2VASC2, etc  Document your independent review of radiology images and any outside records  Document your discussion  with family members, caretakers and with consultants  Document social determinants of health affecting pt's care  Document your decision making why or why not admission, treatments were needed:32947:::1}   Final diagnoses:  None    ED Discharge Orders     None

## 2024-06-09 ENCOUNTER — Ambulatory Visit: Admitting: Internal Medicine

## 2024-06-12 ENCOUNTER — Ambulatory Visit: Admitting: Internal Medicine

## 2024-06-16 ENCOUNTER — Encounter (INDEPENDENT_AMBULATORY_CARE_PROVIDER_SITE_OTHER): Payer: Self-pay

## 2024-06-16 ENCOUNTER — Ambulatory Visit: Admitting: Infectious Diseases

## 2024-06-17 ENCOUNTER — Other Ambulatory Visit: Payer: Self-pay

## 2024-06-17 NOTE — Progress Notes (Signed)
 Specialty Pharmacy Refill Coordination Note  Pam Jones is a 23 y.o. female contacted today regarding refills of specialty medication(s) Dolutegravir -lamiVUDine  (DOVATO )   Patient requested Delivery   Delivery date: 06/18/24   Verified address: (Patient-Rptd) [address] [place] [address]   Medication will be filled on 06/17/24.

## 2024-06-23 ENCOUNTER — Ambulatory Visit (INDEPENDENT_AMBULATORY_CARE_PROVIDER_SITE_OTHER): Admitting: Infectious Diseases

## 2024-06-23 ENCOUNTER — Encounter: Payer: Self-pay | Admitting: Infectious Diseases

## 2024-06-23 ENCOUNTER — Other Ambulatory Visit: Payer: Self-pay

## 2024-06-23 ENCOUNTER — Other Ambulatory Visit (HOSPITAL_COMMUNITY): Payer: Self-pay

## 2024-06-23 DIAGNOSIS — Z21 Asymptomatic human immunodeficiency virus [HIV] infection status: Secondary | ICD-10-CM | POA: Diagnosis not present

## 2024-06-23 DIAGNOSIS — R140 Abdominal distension (gaseous): Secondary | ICD-10-CM | POA: Diagnosis not present

## 2024-06-23 DIAGNOSIS — R7989 Other specified abnormal findings of blood chemistry: Secondary | ICD-10-CM | POA: Diagnosis not present

## 2024-06-23 DIAGNOSIS — R109 Unspecified abdominal pain: Secondary | ICD-10-CM | POA: Diagnosis not present

## 2024-06-23 MED ORDER — DOLUTEGRAVIR-LAMIVUDINE 50-300 MG PO TABS
1.0000 | ORAL_TABLET | Freq: Every day | ORAL | 3 refills | Status: AC
  Filled 2024-06-23: qty 90, 90d supply, fill #0
  Filled 2024-07-10: qty 30, 30d supply, fill #0
  Filled 2024-08-07 – 2024-08-11 (×3): qty 30, 30d supply, fill #1
  Filled 2024-09-11: qty 30, 30d supply, fill #2
  Filled 2024-10-07 – 2024-10-08 (×2): qty 30, 30d supply, fill #3
  Filled 2024-11-04 (×3): qty 30, 30d supply, fill #4
  Filled 2024-12-02 – 2024-12-07 (×2): qty 30, 30d supply, fill #5
  Filled 2025-01-01: qty 30, 30d supply, fill #6

## 2024-06-23 NOTE — Patient Instructions (Signed)
  Will try to write the prescription for a 90 day fill but insurance may poo poo that   Schedule a follow up pap smear with me anytime this year - can time it with your next routine check in also if you prefer.

## 2024-06-23 NOTE — Progress Notes (Signed)
 Name: Pam Jones  DOB: [birth year] MRN: [id number] PCP: Billy Knee, FNP    Brief Narrative:  Pam Jones is a 23 y.o. female with HIV diagnosed in May of 2023.  CD4 nadir 350 HIV Risk: sexual History of OIs: none Intake Labs: Hep B sAg (-), sAb (+), cAb (-); Hep A (-), Hep C (-) Quantiferon (-) HLA B*5701 () G6PD: ()   Previous Regimens: Tivicay  + Descovy  (during pregnancy 2024)  Dovato    Genotypes: 2023 - VL too small to detect    Subjective:   Chief Complaint  Patient presents with   Follow-up    Discussed the use of AI scribe software for clinical note transcription with the patient, who gave verbal consent to proceed.  History of Present Illness   Pam Jones is a 23 year old female with HIV who presents for a follow-up visit.  She experiences gastrointestinal symptoms, including a sensation of her stomach 'turning', bloating, and occasional diarrhea, primarily in the mornings at work. Symptoms are absent on weekends and at home. No changes in diet, such as artificial sweeteners or almond milk, have been made. These symptoms seem consistently present at work when other employees arrive. Works in a [employer] with a lot of people in close area that is anxiety invoking.   She is currently on Dovato  for HIV management and reports no significant issues with the medication. She also takes contraceptives and has been prescribed Bentyl  (dicyclomine ) for abdominal cramping, which she has not yet started using regularly. She recalls being given Bentyl  during a recent ER visit for cramping symptoms.  Continues on her oral contraceptives - no sexual health concerns   She has a history of an abnormal Pap smear in 2024, which showed low-grade changes but no significant HPV. She recalls being told she tested positive for HPV at a health department in [place], but subsequent tests have not confirmed this. She has had two or three Pap smears in total  and is aware of the need for regular monitoring due to her HIV status.         11/12/2023    2:06 PM  Depression screen PHQ 2/9  Decreased Interest 0  Down, Depressed, Hopeless 0  PHQ - 2 Score 0    Review of Systems  Constitutional:  Negative for chills, fever, malaise/fatigue and weight loss.  HENT:  Negative for sore throat.        No dental problems  Respiratory:  Negative for cough and sputum production.   Cardiovascular:  Negative for chest pain and leg swelling.  Gastrointestinal:  Negative for abdominal pain, diarrhea and vomiting.  Genitourinary:  Negative for dysuria and flank pain.  Musculoskeletal:  Negative for joint pain, myalgias and neck pain.  Skin:  Negative for rash.  Neurological:  Negative for dizziness, tingling and headaches.  Psychiatric/Behavioral:  Negative for depression and substance abuse. The patient is not nervous/anxious and does not have insomnia.     Past Medical History:  Diagnosis Date   COVID-19    04/2021   HIV (human immunodeficiency virus infection) (HCC) 03/2022   Primary hypertension 05/06/2023    Outpatient Medications Prior to Visit  Medication Sig Dispense Refill   LO LOESTRIN FE  1 MG-10 MCG / 10 MCG tablet Take 1 tablet by mouth daily. 84 tablet 3   dolutegravir -lamiVUDine  (DOVATO ) 50-300 MG tablet Take 1 tablet by mouth daily. 30 tablet 11   dicyclomine  (BENTYL ) 20 MG tablet Take 1 tablet (20  mg total) by mouth 2 (two) times daily. (Patient not taking: Reported on 06/23/2024) 20 tablet 0   NIFEdipine  (ADALAT  CC) 30 MG 24 hr tablet TAKE 1 TABLET BY MOUTH EVERY DAY (Patient not taking: Reported on 06/23/2024) 90 tablet 1   ondansetron  (ZOFRAN -ODT) 4 MG disintegrating tablet Take 1 tablet (4 mg total) by mouth every 8 (eight) hours as needed for nausea or vomiting. (Patient not taking: Reported on 06/23/2024) 20 tablet 0   No facility-administered medications prior to visit.     Allergies  Allergen Reactions   Amoxicillin Hives    Banana Itching and Swelling   Doxycycline Hives   Kiwi Extract Swelling   Nirmatrelvir Hives   Paxlovid [Nirmatrelvir-Ritonavir] Hives   Strawberry (Diagnostic) Swelling    Social History   Tobacco Use   Smoking status: Never    Passive exposure: Yes   Smokeless tobacco: Never  Vaping Use   Vaping status: Never Used  Substance Use Topics   Alcohol use: Yes    Comment: occ   Drug use: Not Currently    Frequency: 7.0 times per week    Types: Marijuana    Comment: Daily    Social History   Substance and Sexual Activity  Sexual Activity Not Currently   Birth control/protection: None     Objective:   Vitals:   06/23/24 1445  BP: 125/62  Pulse: 72  Temp: 97.8 F (36.6 C)  TempSrc: Oral  SpO2: 95%  Weight: 270 lb (122.5 kg)  Height: 5' 10 (1.778 m)    Body mass index is 38.74 kg/m.   Physical Exam Constitutional:      Appearance: Normal appearance. She is not ill-appearing.  HENT:     Mouth/Throat:     Mouth: Mucous membranes are moist.     Pharynx: Oropharynx is clear.  Eyes:     General: No scleral icterus. Cardiovascular:     Rate and Rhythm: Normal rate and regular rhythm.  Pulmonary:     Effort: Pulmonary effort is normal.  Neurological:     Mental Status: She is oriented to person, place, and time.  Psychiatric:        Mood and Affect: Mood normal.        Thought Content: Thought content normal.     Lab Results Lab Results  Component Value Date   WBC 4.8 06/03/2024   HGB 12.2 06/03/2024   HCT 38.8 06/03/2024   MCV 85.8 06/03/2024   PLT 230 06/03/2024    Lab Results  Component Value Date   CREATININE 1.11 (H) 06/03/2024   BUN 10 06/03/2024   NA 137 06/03/2024   K 3.7 06/03/2024   CL 106 06/03/2024   CO2 23 06/03/2024    Lab Results  Component Value Date   ALT 27 06/03/2024   AST 21 06/03/2024   ALKPHOS 56 06/03/2024   BILITOT 0.7 06/03/2024    Lab Results  Component Value Date   CHOL 143 06/25/2012   HDL 51 06/25/2012    LDLCALC 79 06/25/2012   TRIG 67 06/25/2012   CHOLHDL 2.8 06/25/2012   HIV 1 RNA Quant  Date Value  11/12/2023 <20 Copies/mL (H)  05/28/2023 <20 Copies/mL (H)  02/14/2023 40 copies/mL   CD4 T Cell Abs (/uL)  Date Value  11/12/2023 711  05/28/2023 484  11/01/2022 367 (L)     Assessment & Plan:     HIV infection -  HIV infection is well-managed with Dovato . No current concerns or side effects  related to Dovato  reported. Discussed potential for gastrointestinal issues, though not typically associated with Dovato  at this stage. Emphasized importance of adherence to medication and possibility of adjusting prescription to a 90-day supply if insurance allows. - Continue Dovato  - Attempt to write prescription for 90-day supply if insurance permits - Repeat viral load and CD4 count  Abdominal symptoms likely related to environmental stress -  Abdominal symptoms, including bloating and cramping, are likely stress-related, occurring primarily in the morning at work. Symptoms are absent on weekends or at home, supporting a stress-related etiology. Discussed potential use of Bentyl  (dicyclomine ) to manage symptoms. - Try Bentyl  (dicyclomine ) in the morning to manage symptoms - Monitor symptoms and consider environmental changes if possible  Abnormal Pap smear with low-grade cervical cellular changes -  Previous Pap smear in 2024 showed low-grade cervical cellular changes, likely due to HPV - no  Discussed commonality of low-grade changes in younger women and likelihood of immune system clearance. Recommended repeating Pap smear within the year to monitor changes. - Repeat Pap smear within the year - Ensure HPV testing is included in the next Pap smear  Elevated creatinine, likely transient - Creatinine was slightly elevated, likely due to fasting prior to the test. No significant concern as kidney function is generally stable. Advised to maintain hydration and follow up in a couple of  months. - Ensure adequate hydration - Follow up on creatinine levels in a couple of months      Meds ordered this encounter  Medications   dolutegravir -lamiVUDine  (DOVATO ) 50-300 MG tablet    Sig: Take 1 tablet by mouth daily.    Dispense:  90 tablet    Refill:  3    Patient would like this mailed to her please - she is expecting your call.    Prescription Type::   New Therapy - New Program   Orders Placed This Encounter  Procedures   HIV 1 RNA quant-no reflex-bld   T-helper cells (CD4) count   Return in about 5 months (around 11/23/2024).   Corean Fireman, MSN, NP-C [facility] Pager: [phone] Office: [phone]  06/23/24  3:27 PM

## 2024-06-24 LAB — T-HELPER CELLS (CD4) COUNT (NOT AT ARMC)
CD4 % Helper T Cell: 44 % (ref 33–65)
CD4 T Cell Abs: 725 /uL (ref 400–1790)

## 2024-06-25 LAB — HIV-1 RNA QUANT-NO REFLEX-BLD
HIV 1 RNA Quant: <20 {copies}/mL — AB
HIV-1 RNA Quant, Log: <1.3 {Log_copies}/mL — AB

## 2024-06-26 ENCOUNTER — Ambulatory Visit: Payer: Self-pay | Admitting: Infectious Diseases

## 2024-06-26 ENCOUNTER — Encounter: Payer: Self-pay | Admitting: Internal Medicine

## 2024-07-10 ENCOUNTER — Other Ambulatory Visit: Payer: Self-pay

## 2024-07-10 NOTE — Progress Notes (Signed)
 Patient Satisfaction Survey complete

## 2024-07-10 NOTE — Progress Notes (Signed)
   Specialty Pharmacy Refill Coordination Note  Pam Jones is a 23 y.o. female contacted today regarding refills of specialty medication(s) Dolutegravir-lamiVUDine (DOVATO)   Patient requested Delivery   Delivery date: 07/14/24   Verified address: [address]  [place] [address]   Medication will be filled on 07/13/24.

## 2024-07-13 ENCOUNTER — Other Ambulatory Visit: Payer: Self-pay

## 2024-08-07 ENCOUNTER — Other Ambulatory Visit: Payer: Self-pay

## 2024-08-10 ENCOUNTER — Other Ambulatory Visit (HOSPITAL_COMMUNITY): Payer: Self-pay

## 2024-08-11 ENCOUNTER — Other Ambulatory Visit: Payer: Self-pay

## 2024-08-11 ENCOUNTER — Other Ambulatory Visit: Payer: Self-pay | Admitting: Pharmacy Technician

## 2024-08-11 ENCOUNTER — Encounter (INDEPENDENT_AMBULATORY_CARE_PROVIDER_SITE_OTHER): Payer: Self-pay

## 2024-08-11 NOTE — Progress Notes (Signed)
 Specialty Pharmacy Refill Coordination Note  Pam Jones is a 23 y.o. female contacted today regarding refills of specialty medication(s) Dolutegravir -lamiVUDine  (DOVATO )   Patient requested Delivery   Delivery date: 08/18/24   Verified address: [address] [place] [address]   Medication will be filled on 08/17/24. Questionnaire answered.

## 2024-08-17 ENCOUNTER — Other Ambulatory Visit: Payer: Self-pay

## 2024-09-11 ENCOUNTER — Other Ambulatory Visit: Payer: Self-pay

## 2024-09-11 ENCOUNTER — Encounter (INDEPENDENT_AMBULATORY_CARE_PROVIDER_SITE_OTHER): Payer: Self-pay

## 2024-09-11 NOTE — Progress Notes (Signed)
 Specialty Pharmacy Refill Coordination Note  Pam Jones is a 23 y.o. female contacted today regarding refills of specialty medication(s) Dolutegravir -lamiVUDine  (DOVATO )   Patient requested (Patient-Rptd) Delivery   Delivery date: 09/15/24   Verified address: (Patient-Rptd) [address] [name] [address]   Medication will be filled on 09/14/24.

## 2024-09-14 ENCOUNTER — Other Ambulatory Visit: Payer: Self-pay

## 2024-10-07 ENCOUNTER — Other Ambulatory Visit: Payer: Self-pay

## 2024-10-08 ENCOUNTER — Other Ambulatory Visit: Payer: Self-pay

## 2024-10-11 ENCOUNTER — Encounter (INDEPENDENT_AMBULATORY_CARE_PROVIDER_SITE_OTHER): Payer: Self-pay

## 2024-10-12 ENCOUNTER — Other Ambulatory Visit: Payer: Self-pay

## 2024-10-12 NOTE — Progress Notes (Signed)
 Specialty Pharmacy Refill Coordination Note  Pam Jones is a 23 y.o. female contacted today regarding refills of specialty medication(s) Dolutegravir -lamiVUDine  (DOVATO )   Patient requested (Patient-Rptd) Delivery   Delivery date: 10/14/24   Verified address: [address] [name] [address]   Medication will be filled on: 10/13/24

## 2024-11-04 ENCOUNTER — Other Ambulatory Visit: Payer: Self-pay | Admitting: Pharmacy Technician

## 2024-11-04 ENCOUNTER — Other Ambulatory Visit: Payer: Self-pay

## 2024-11-04 NOTE — Progress Notes (Signed)
 Specialty Pharmacy Refill Coordination Note  Pam Jones is a 23 y.o. female contacted today regarding refills of specialty medication(s)  Dolutegravir -lamiVUDine  (DOVATO )   Patient requested (Patient-Rptd) Delivery   Delivery date: 11/10/2024   Verified address: (Patient-Rptd) [address] [name] [address]   Medication will be filled on: 11/09/2024

## 2024-11-09 ENCOUNTER — Other Ambulatory Visit: Payer: Self-pay

## 2024-11-12 ENCOUNTER — Ambulatory Visit: Admitting: Infectious Diseases

## 2024-12-02 ENCOUNTER — Other Ambulatory Visit (HOSPITAL_COMMUNITY): Payer: Self-pay

## 2024-12-07 ENCOUNTER — Other Ambulatory Visit (HOSPITAL_COMMUNITY): Payer: Self-pay

## 2024-12-07 ENCOUNTER — Ambulatory Visit: Admitting: Infectious Diseases

## 2024-12-07 ENCOUNTER — Other Ambulatory Visit: Payer: Self-pay

## 2024-12-07 ENCOUNTER — Encounter: Payer: Self-pay | Admitting: Infectious Diseases

## 2024-12-07 VITALS — Ht 70.0 in | Wt 260.0 lb

## 2024-12-07 DIAGNOSIS — B20 Human immunodeficiency virus [HIV] disease: Secondary | ICD-10-CM | POA: Diagnosis not present

## 2024-12-07 DIAGNOSIS — Z21 Asymptomatic human immunodeficiency virus [HIV] infection status: Secondary | ICD-10-CM

## 2024-12-07 NOTE — Progress Notes (Signed)
 Specialty Pharmacy Refill Coordination Note  MyChart Questionnaire Submission  Pam Jones is a 24 y.o. female contacted today regarding refills of specialty medication(s) Dovato .  Doses on hand: (Patient-Rptd) 10  Patient requested: (Patient-Rptd) Delivery   Delivery date: 12/14/24  Verified address: [address] [place]  [address]  Medication will be filled on 12/11/24

## 2024-12-07 NOTE — Progress Notes (Signed)
 "  Name: Pam Jones  DOB: 11-13-2001 MRN: [id number] PCP: Billy Knee, FNP    Brief Narrative:  ARIYAH Jones is a 24 y.o. female with HIV diagnosed in May of 2023.  CD4 nadir 350 HIV Risk: sexual History of OIs: none Intake Labs: Hep B sAg (-), sAb (+), cAb (-); Hep A (-), Hep C (-) Quantiferon (-) HLA B*5701 () G6PD: ()   Previous Regimens: Tivicay  + Descovy  (during pregnancy 2024)  Dovato    Genotypes: 2023 - VL too small to detect    Subjective:   Chief Complaint  Patient presents with   Follow-up    Discussed the use of AI scribe software for clinical note transcription with the patient, who gave verbal consent to proceed.  History of Present Illness   Pam Jones is a 24 year old female with HIV who presents for routine follow-up care.  She is currently stable on her HIV medication, Dovato , which she takes once daily. Her last viral load was undetectable, and her CD4 count was 725 in July 2025.  She has no new symptoms or concerns since her last visit. She feels well and has no complaints. No sexual health concerns are noted, and her menstrual cycles are regular.  She experienced nausea and cramps with birth control pills in the past and has since opted for natural birth plan methods and cycle tracking.         11/12/2023    2:06 PM  Depression screen PHQ 2/9  Decreased Interest 0  Down, Depressed, Hopeless 0  PHQ - 2 Score 0    Review of Systems  Constitutional:  Negative for chills, fever, malaise/fatigue and weight loss.  HENT:  Negative for sore throat.        No dental problems  Respiratory:  Negative for cough and sputum production.   Cardiovascular:  Negative for chest pain and leg swelling.  Gastrointestinal:  Negative for abdominal pain, diarrhea and vomiting.  Genitourinary:  Negative for dysuria and flank pain.  Musculoskeletal:  Negative for joint pain, myalgias and neck pain.  Skin:  Negative for rash.   Neurological:  Negative for dizziness, tingling and headaches.  Psychiatric/Behavioral:  Negative for depression and substance abuse. The patient is not nervous/anxious and does not have insomnia.     Past Medical History:  Diagnosis Date   COVID-19    04/2021   HIV (human immunodeficiency virus infection) (HCC) 03/2022   Primary hypertension 05/06/2023    Outpatient Medications Prior to Visit  Medication Sig Dispense Refill   dolutegravir -lamiVUDine  (DOVATO ) 50-300 MG tablet Take 1 tablet by mouth daily. 90 tablet 3   dicyclomine  (BENTYL ) 20 MG tablet Take 1 tablet (20 mg total) by mouth 2 (two) times daily. (Patient not taking: Reported on 06/23/2024) 20 tablet 0   LO LOESTRIN FE  1 MG-10 MCG / 10 MCG tablet Take 1 tablet by mouth daily. (Patient not taking: Reported on 12/07/2024) 84 tablet 3   No facility-administered medications prior to visit.     Allergies  Allergen Reactions   Amoxicillin Hives   Doxycycline Hives   Kiwi Extract Swelling   Nirmatrelvir Hives   Paxlovid [Nirmatrelvir-Ritonavir] Hives   Strawberry (Diagnostic) Swelling    Social History   Tobacco Use   Smoking status: Never    Passive exposure: Yes   Smokeless tobacco: Never  Vaping Use   Vaping status: Never Used  Substance Use Topics   Alcohol use: Yes    Comment: occ  Drug use: Not Currently    Frequency: 7.0 times per week    Types: Marijuana    Comment: Daily    Social History   Substance and Sexual Activity  Sexual Activity Not Currently   Birth control/protection: None     Objective:   Vitals:   12/07/24 1413  Weight: 260 lb (117.9 kg)  Height: 5' 10 (1.778 m)    Body mass index is 37.31 kg/m.   Physical Exam Constitutional:      Appearance: Normal appearance. She is not ill-appearing.  HENT:     Mouth/Throat:     Mouth: Mucous membranes are moist.     Pharynx: Oropharynx is clear.  Eyes:     General: No scleral icterus. Cardiovascular:     Rate and Rhythm:  Normal rate and regular rhythm.  Pulmonary:     Effort: Pulmonary effort is normal.  Neurological:     Mental Status: She is oriented to person, place, and time.  Psychiatric:        Mood and Affect: Mood normal.        Thought Content: Thought content normal.     Lab Results Lab Results  Component Value Date   WBC 4.8 06/03/2024   HGB 12.2 06/03/2024   HCT 38.8 06/03/2024   MCV 85.8 06/03/2024   PLT 230 06/03/2024    Lab Results  Component Value Date   CREATININE 1.11 (H) 06/03/2024   BUN 10 06/03/2024   NA 137 06/03/2024   K 3.7 06/03/2024   CL 106 06/03/2024   CO2 23 06/03/2024    Lab Results  Component Value Date   ALT 27 06/03/2024   AST 21 06/03/2024   ALKPHOS 56 06/03/2024   BILITOT 0.7 06/03/2024    Lab Results  Component Value Date   CHOL 143 06/25/2012   HDL 51 06/25/2012   LDLCALC 79 06/25/2012   TRIG 67 06/25/2012   CHOLHDL 2.8 06/25/2012   HIV 1 RNA Quant  Date Value  06/23/2024 <20 DETECTED copies/mL (A)  11/12/2023 <20 Copies/mL (H)  05/28/2023 <20 Copies/mL (H)   CD4 T Cell Abs (/uL)  Date Value  06/23/2024 725  11/12/2023 711  05/28/2023 484     Assessment & Plan:     Human immunodeficiency virus (HIV) infection - HIV infection is well-controlled with Dovato  once daily. Viral load is undetectable and CD4 count is 725, indicating good immune function. - Continue Dovato  once daily - Scheduled six-month follow-up for routine HIV care  Health maintenance -  Defer pap smear today as she has her son with her. Will plan on next OV scheduled or anytime that's convenient for her in between. Can just call to ensure I am in clinic  Discussed Flu vaccine today - she will consider She had 1 HPV vaccine documented in the system however in looking at [facility] database I don't see any documented. Will d/w her at upcoming visit recommendations to start over with 3 dose series given uncertainty.      No orders of the defined types were placed in this  encounter.  Orders Placed This Encounter  Procedures   Measles/Mumps/Rubella Immunity   HIV 1 RNA quant-no reflex-bld   T-helper cells (CD4) count   Return in about 6 months (around 06/06/2025).   Corean Fireman, MSN, NP-C [facility] Pager: [phone] Office: [phone]  Visit involved longitudinal management of chronic HIV infection, including review of viral suppression, immune status, medication adherence,  long-term complications, and coordination of preventive care and surveillance.   12/07/2024  3:13 PM   "

## 2024-12-07 NOTE — Patient Instructions (Signed)
 Can do your pap smear whenever convenienvt for you   Your primary can too if you have a visit with her soon   Also happy to do it at your follow up in 73m

## 2024-12-08 LAB — T-HELPER CELLS (CD4) COUNT (NOT AT ARMC)
CD4 % Helper T Cell: 43 % (ref 33–65)
CD4 T Cell Abs: 621 /uL (ref 400–1790)

## 2024-12-10 LAB — MEASLES/MUMPS/RUBELLA IMMUNITY
Mumps IgG: 49.1 [AU]/ml
Rubella: 2.89 {index}
Rubeola IgG: 77.5 [AU]/ml

## 2024-12-10 LAB — HIV-1 RNA QUANT-NO REFLEX-BLD

## 2024-12-11 ENCOUNTER — Other Ambulatory Visit: Payer: Self-pay

## 2024-12-24 ENCOUNTER — Ambulatory Visit: Payer: Self-pay | Admitting: Infectious Diseases

## 2024-12-24 DIAGNOSIS — Z21 Asymptomatic human immunodeficiency virus [HIV] infection status: Secondary | ICD-10-CM

## 2025-01-01 ENCOUNTER — Other Ambulatory Visit: Payer: Self-pay

## 2025-05-26 ENCOUNTER — Ambulatory Visit: Payer: Self-pay | Admitting: Infectious Diseases
